# Patient Record
Sex: Male | Born: 1965 | Race: Black or African American | Hispanic: No | Marital: Married | Smoking: Former smoker
Health system: Southern US, Community
[De-identification: ages and names within clinical notes are randomized; demographics above are authoritative.]

## PROBLEM LIST (undated history)

## (undated) DIAGNOSIS — Z9289 Personal history of other medical treatment: Secondary | ICD-10-CM

## (undated) DIAGNOSIS — N486 Induration penis plastica: Secondary | ICD-10-CM

## (undated) DIAGNOSIS — E785 Hyperlipidemia, unspecified: Secondary | ICD-10-CM

## (undated) DIAGNOSIS — E669 Obesity, unspecified: Secondary | ICD-10-CM

## (undated) DIAGNOSIS — K219 Gastro-esophageal reflux disease without esophagitis: Secondary | ICD-10-CM

## (undated) DIAGNOSIS — D179 Benign lipomatous neoplasm, unspecified: Secondary | ICD-10-CM

## (undated) DIAGNOSIS — E559 Vitamin D deficiency, unspecified: Secondary | ICD-10-CM

## (undated) DIAGNOSIS — Z8249 Family history of ischemic heart disease and other diseases of the circulatory system: Secondary | ICD-10-CM

## (undated) HISTORY — DX: Induration penis plastica: N48.6

## (undated) HISTORY — DX: Obesity, unspecified: E66.9

## (undated) HISTORY — DX: Gastro-esophageal reflux disease without esophagitis: K21.9

## (undated) HISTORY — DX: Hyperlipidemia, unspecified: E78.5

## (undated) HISTORY — DX: Personal history of other medical treatment: Z92.89

## (undated) HISTORY — DX: Benign lipomatous neoplasm, unspecified: D17.9

## (undated) HISTORY — PX: OTHER SURGICAL HISTORY: SHX169

## (undated) HISTORY — DX: Family history of ischemic heart disease and other diseases of the circulatory system: Z82.49

## (undated) HISTORY — DX: Vitamin D deficiency, unspecified: E55.9

---

## 1998-04-30 ENCOUNTER — Encounter: Payer: Self-pay | Admitting: Emergency Medicine

## 1998-04-30 ENCOUNTER — Emergency Department (HOSPITAL_COMMUNITY): Admission: EM | Admit: 1998-04-30 | Discharge: 1998-04-30 | Payer: Self-pay | Admitting: Emergency Medicine

## 1998-10-21 ENCOUNTER — Emergency Department (HOSPITAL_COMMUNITY): Admission: EM | Admit: 1998-10-21 | Discharge: 1998-10-21 | Payer: Self-pay | Admitting: Emergency Medicine

## 1998-10-27 ENCOUNTER — Emergency Department (HOSPITAL_COMMUNITY): Admission: EM | Admit: 1998-10-27 | Discharge: 1998-10-27 | Payer: Self-pay | Admitting: Emergency Medicine

## 1999-05-20 ENCOUNTER — Encounter: Payer: Self-pay | Admitting: Emergency Medicine

## 1999-05-20 ENCOUNTER — Emergency Department (HOSPITAL_COMMUNITY): Admission: EM | Admit: 1999-05-20 | Discharge: 1999-05-20 | Payer: Self-pay | Admitting: Emergency Medicine

## 1999-06-30 ENCOUNTER — Emergency Department (HOSPITAL_COMMUNITY): Admission: EM | Admit: 1999-06-30 | Discharge: 1999-06-30 | Payer: Self-pay | Admitting: Internal Medicine

## 1999-10-10 ENCOUNTER — Encounter: Payer: Self-pay | Admitting: Emergency Medicine

## 1999-10-10 ENCOUNTER — Emergency Department (HOSPITAL_COMMUNITY): Admission: EM | Admit: 1999-10-10 | Discharge: 1999-10-10 | Payer: Self-pay | Admitting: Emergency Medicine

## 2007-03-29 ENCOUNTER — Ambulatory Visit: Payer: Self-pay | Admitting: Family Medicine

## 2007-11-30 ENCOUNTER — Ambulatory Visit: Payer: Self-pay | Admitting: Family Medicine

## 2008-01-25 ENCOUNTER — Ambulatory Visit: Payer: Self-pay | Admitting: Family Medicine

## 2009-03-08 ENCOUNTER — Ambulatory Visit: Payer: Self-pay | Admitting: Family Medicine

## 2010-01-06 ENCOUNTER — Ambulatory Visit: Payer: Self-pay | Admitting: Family Medicine

## 2010-03-10 ENCOUNTER — Ambulatory Visit: Payer: Self-pay | Admitting: Family Medicine

## 2010-06-03 ENCOUNTER — Ambulatory Visit: Payer: Self-pay | Admitting: Cardiology

## 2010-06-16 ENCOUNTER — Encounter: Payer: Self-pay | Admitting: Cardiology

## 2010-06-19 ENCOUNTER — Ambulatory Visit: Admit: 2010-06-19 | Payer: Self-pay

## 2010-12-07 HISTORY — PX: CARDIAC CATHETERIZATION: SHX172

## 2010-12-31 ENCOUNTER — Observation Stay (HOSPITAL_COMMUNITY)
Admission: EM | Admit: 2010-12-31 | Discharge: 2010-12-31 | Disposition: A | Discharge status: Home / Self Care | Payer: Self-pay | Attending: Emergency Medicine | Admitting: Emergency Medicine

## 2010-12-31 ENCOUNTER — Emergency Department (HOSPITAL_COMMUNITY): Payer: Self-pay

## 2010-12-31 DIAGNOSIS — R7989 Other specified abnormal findings of blood chemistry: Secondary | ICD-10-CM

## 2010-12-31 DIAGNOSIS — Z91041 Radiographic dye allergy status: Secondary | ICD-10-CM | POA: Insufficient documentation

## 2010-12-31 DIAGNOSIS — R51 Headache: Secondary | ICD-10-CM | POA: Insufficient documentation

## 2010-12-31 DIAGNOSIS — Z7902 Long term (current) use of antithrombotics/antiplatelets: Secondary | ICD-10-CM | POA: Insufficient documentation

## 2010-12-31 DIAGNOSIS — R0602 Shortness of breath: Secondary | ICD-10-CM | POA: Insufficient documentation

## 2010-12-31 DIAGNOSIS — R079 Chest pain, unspecified: Secondary | ICD-10-CM

## 2010-12-31 DIAGNOSIS — E785 Hyperlipidemia, unspecified: Secondary | ICD-10-CM | POA: Insufficient documentation

## 2010-12-31 LAB — POCT I-STAT, CHEM 8
BUN: 18 mg/dL (ref 6–23)
Calcium, Ion: 1.13 mmol/L (ref 1.12–1.32)
Chloride: 98 mEq/L (ref 96–112)
Creatinine, Ser: 1.2 mg/dL (ref 0.50–1.35)
Glucose, Bld: 100 mg/dL — ABNORMAL HIGH (ref 70–99)
HCT: 46 % (ref 39.0–52.0)
Hemoglobin: 15.6 g/dL (ref 13.0–17.0)
Potassium: 3.7 mEq/L (ref 3.5–5.1)
Sodium: 136 mEq/L (ref 135–145)
TCO2: 27 mmol/L (ref 0–100)

## 2010-12-31 LAB — CK TOTAL AND CKMB (NOT AT ARMC)
CK, MB: 3.5 ng/mL (ref 0.3–4.0)
CK, MB: 3.2 ng/mL (ref 0.3–4.0)
Relative Index: 1.8 (ref 0.0–2.5)
Relative Index: 2.3 (ref 0.0–2.5)
Total CK: 154 U/L (ref 7–232)
Total CK: 143 U/L (ref 7–232)

## 2010-12-31 LAB — DIFFERENTIAL
Eosinophils Relative: 2 % (ref 0–5)
Lymphocytes Relative: 28 % (ref 12–46)
Lymphs Abs: 2.1 10*3/uL (ref 0.7–4.0)
Neutrophils Relative %: 61 % (ref 43–77)

## 2010-12-31 LAB — CBC
HCT: 41.2 % (ref 39.0–52.0)
Hemoglobin: 14.6 g/dL (ref 13.0–17.0)
MCV: 85.3 fL (ref 78.0–100.0)
RBC: 4.83 MIL/uL (ref 4.22–5.81)
WBC: 7.5 10*3/uL (ref 4.0–10.5)

## 2010-12-31 LAB — LIPID PANEL
LDL Cholesterol: 117 mg/dL — ABNORMAL HIGH (ref 0–99)
VLDL: 20 mg/dL (ref 0–40)

## 2010-12-31 LAB — TROPONIN I: Troponin I: 0.33 ng/mL (ref <0.30)

## 2010-12-31 LAB — TSH: TSH: 1.865 u[IU]/mL (ref 0.350–4.500)

## 2011-01-02 ENCOUNTER — Ambulatory Visit (INDEPENDENT_AMBULATORY_CARE_PROVIDER_SITE_OTHER): Payer: Self-pay | Admitting: Physician Assistant

## 2011-01-02 ENCOUNTER — Encounter: Payer: Self-pay | Admitting: Physician Assistant

## 2011-01-02 VITALS — BP 100/80 | HR 71 | Ht 72.0 in | Wt 224.0 lb

## 2011-01-02 DIAGNOSIS — R079 Chest pain, unspecified: Secondary | ICD-10-CM

## 2011-01-02 DIAGNOSIS — E785 Hyperlipidemia, unspecified: Secondary | ICD-10-CM

## 2011-01-02 DIAGNOSIS — R002 Palpitations: Secondary | ICD-10-CM | POA: Insufficient documentation

## 2011-01-02 MED ORDER — FAMOTIDINE 20 MG PO TABS: 20.0000 mg | ORAL_TABLET | Freq: Two times a day (BID) | ORAL | Status: DC

## 2011-01-02 NOTE — Progress Notes (Addendum)
History of Present Illness: Primary Cardiologist:  Dr. Marca Ancona  Aaron Patel is a 45 y.o. male who presents for chest fluttering.    He was just in the hospital 7/25 with chest pain.  He had one troponin that returned abnormal at 0.33.  This was concerning for a non-ST elevation myocardial infarction.  He was taken to the cardiac catheterization lab.  This demonstrated no significant CAD.  He had an EF of 60%.  He was done via a right radial approach.  No further cardiac workup was recommended.  It was recommended that he follow up with cardiology on a PRN basis.  Labs: Potassium 3.7, creatinine 1.2, glucose 100, hemoglobin 14.6, TC 186, TG 98, HDL 49, LDL 117, TSH 1.865, BNP 19.  CXR unremarkable.  Head CT unremarkable.  He notes a long history of palpitations.  He feels as though his heart is going to fast.  He did see Dr. Swaziland about 5-6 years ago and had a negative stress test.  He is never wore a monitor.  He denies associated lightheadedness or dizziness.  He denies syncope.  He still has had some chest pressure.  He does note some association with meals.  Belching seems to help his symptoms.  He denies dysphagia or water brash symptoms.  He denies melena or hematemesis.  He will awaken sometimes to catch his breath.  He denies any witnessed snoring or witnessed apneic episodes.  He denies significant daytime hypersomnolence.  He denies edema.  He denies orthopnea.  He denies exertional chest pain or shortness of breath.   Past Medical History  Diagnosis Date  . HLD (hyperlipidemia)   . Headache   . Chest pain     cath 12/31/10: post AV CFX < 10% (no significant CAD), EF 60%     Current Outpatient Prescriptions  Medication Sig Dispense Refill  . lovastatin (MEVACOR) 20 MG tablet Take 20 mg by mouth at bedtime.          Allergies: Allergies  Allergen Reactions  . Iodinated Diagnostic Agents     Social history:  He is in eighth grade teacher.  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   He referees basketball.  He is a nonsmoker.  ROS:  Please see the history of present illness.  All other systems reviewed and negative.  Vital Signs: BP 100/80  Pulse 71  Ht 6' (1.829 m)  Wt 224 lb (101.606 kg)  BMI 30.38 kg/m2  PHYSICAL EXAM: Well nourished, well developed, in no acute distress HEENT: normal Neck: no JVD Endocrine: No thyromegaly Cardiac:  normal S1, S2; RRR; no murmur Lungs:  clear to auscultation bilaterally, no wheezing, rhonchi or rales Abd: soft, nontender, no hepatomegaly Ext: no edema; Right radial site without hematoma  Skin: warm and dry Neuro:  CNs 2-12 intact, no focal abnormalities noted  EKG:  Sinus rhythm, heart rate 71, normal axis, no ischemic changes  ASSESSMENT AND PLAN:

## 2011-01-02 NOTE — Assessment & Plan Note (Signed)
Managed by PCP

## 2011-01-02 NOTE — Assessment & Plan Note (Signed)
Etiology of his symptoms are unclear.  He did have a normal TSH in the hospital.  He may be describing premature contractions.  I cannot rule out the possibility of SVT.  I will set him up for a 2-D echocardiogram and an event monitor.  He will follow up with Dr. Shirlee Latch in 6 weeks.

## 2011-01-02 NOTE — Assessment & Plan Note (Signed)
I suspect he is having problems with acid reflux.  I will place him on Pepcid 20 mg twice a day.

## 2011-01-02 NOTE — Patient Instructions (Signed)
Your physician recommends that you schedule a follow-up appointment in: 02/18/11 @ 4:00 pm to see Dr. Shirlee Latch  Your physician has recommended you make the following change in your medication: START PEPCID 20 MG 1 TABLET TWICE DAILY FOR 4-6 WEEKS THEN TAKE ONLY AS NEEDED TWICE DAILY.  Your physician has recommended that you wear an event monitor DX 785.1. Event monitors are medical devices that record the heart's electrical activity. Doctors most often [place] these monitors to diagnose arrhythmias. Arrhythmias are problems with the speed or rhythm of the heartbeat. The monitor is a small, portable device. You can wear one while you do your normal daily activities. This is usually used to diagnose what is causing palpitations/syncope (passing out).   Your physician has requested that you have an echocardiogram DX 786.50. Echocardiography is a painless test that uses sound waves to create images of your heart. It provides your doctor with information about the size and shape of your heart and how well your heart's chambers and valves are working. This procedure takes approximately one hour. There are no restrictions for this procedure.

## 2011-01-07 DIAGNOSIS — Z9289 Personal history of other medical treatment: Secondary | ICD-10-CM

## 2011-01-07 HISTORY — DX: Personal history of other medical treatment: Z92.89

## 2011-01-08 NOTE — Cardiovascular Report (Signed)
Aaron Patel, Aaron Patel              ACCOUNT NO.:  [id number]  MEDICAL RECORD NO.:  [id number]  LOCATION:                                 FACILITY:  PHYSICIAN:  Lorine Bears, MD     DATE OF BIRTH:  10-09-1965  DATE OF PROCEDURE: DATE OF DISCHARGE:                           CARDIAC CATHETERIZATION   REFERRING PHYSICIAN:  Marca Ancona, MD  PROCEDURES PERFORMED: 1. Left heart catheterization. 2. Coronary angiography. 3. Left ventricular angiography.  INDICATIONS AND CLINICAL HISTORY:  This is a 45 year old male with no previous cardiac history.  He presented with symptoms of chest pain. ECG showed normal sinus rhythm with no significant ischemic changes. His first set of cardiac enzymes was borderline elevated with a troponin of 0.33.  Subsequent troponin came back negative.  Due to his symptoms and elevated cardiac enzymes, cardiac catheterization was recommended. The patient was already given aspirin and Brilinta.  Risks, benefits, and alternatives were discussed with the patient.  ACCESS:  Right radial artery.  STUDY DETAILS:  A standard informed consent was obtained.  The right radial area was prepped in a sterile fashion.  It was anesthetized with 1% lidocaine.  A 5-French sheath was placed in the right radial artery after anterior puncture.  3 mg of verapamil was given through the sheath.  5000 units of heparin was given intravenously.  Coronary angiography was performed with a Jacky catheter.  Left ventricular angiography was performed with a pigtail catheter in the RAO position. All catheter exchanges were done over the wire.  Catheter was then removed over a wire.  The sheath was removed and a TR band was applied. There were no immediate complications.  STUDY FINDINGS:  Hemodynamic findings:  Left ventricular pressure is 121/2 with a left ventricular end-diastolic pressure of 9 mmHg.  Central aortic pressure is 116/71 with a mean pressure of 89 mmHg.  Left  ventricular angiography:  This showed normal LV systolic function and wall motion.  Estimated ejection fraction is 60%.  Coronary angiography: 1. Left main coronary artery:  The vessel is normal in size, but     short.  It is free of any significant disease. 2. Left circumflex:  The vessel is large in size and codominant.  It     has minor irregularities without any obstructive disease.  OM-1 is     small in size and free of significant disease.  OM-2 is medium size     and free of significant disease.  OM-3 is normal in size.  The     posterior AV groove is normal in size with mild proximal disease of     less than 10%.  It gives two posterolateral branches. 3. Left anterior descending artery:  The vessel is normal in size with     minor irregularities, but no evidence of obstructive disease.  The     LAD itself does not reach the apex which is supplied by a large     third diagonal branch which actually wraps around the apex.  First     and second diagonals are normal in size and free of significant     disease. 4. Right coronary artery:  The  vessel is normal in size and     codominant.  It is free of any significant disease.  It gives a     medium-sized PDA distally.  STUDY CONCLUSIONS: 1. No significant coronary artery disease. 2. Normal LV systolic function. 3. Chest pain is likely noncardiac.     Lorine Bears, MD     MA/MEDQ  D:  12/31/2010  T:  01/01/2011  Job:  [id number]  Electronically Signed by Lorine Bears MD on 01/08/2011 02:25:55 PM

## 2011-01-09 ENCOUNTER — Encounter (INDEPENDENT_AMBULATORY_CARE_PROVIDER_SITE_OTHER): Payer: Self-pay

## 2011-01-09 ENCOUNTER — Ambulatory Visit (HOSPITAL_COMMUNITY): Payer: Self-pay | Attending: Cardiology | Admitting: Radiology

## 2011-01-09 DIAGNOSIS — R079 Chest pain, unspecified: Secondary | ICD-10-CM | POA: Insufficient documentation

## 2011-01-09 DIAGNOSIS — R002 Palpitations: Secondary | ICD-10-CM

## 2011-01-09 DIAGNOSIS — R072 Precordial pain: Secondary | ICD-10-CM

## 2011-02-12 ENCOUNTER — Telehealth: Payer: Self-pay | Admitting: *Deleted

## 2011-02-12 NOTE — Telephone Encounter (Signed)
Dr Shirlee Latch reviewed monitor 01/09/11-02/07/11 occasional PVCs , o/w no sig. arrhythmia--LMTCB for pt

## 2011-02-16 NOTE — Telephone Encounter (Signed)
appt 02/18/11 with Dr Shirlee Latch.

## 2011-02-18 ENCOUNTER — Ambulatory Visit (INDEPENDENT_AMBULATORY_CARE_PROVIDER_SITE_OTHER): Payer: Self-pay | Admitting: Cardiology

## 2011-02-18 ENCOUNTER — Encounter: Payer: Self-pay | Admitting: Cardiology

## 2011-02-18 ENCOUNTER — Telehealth: Payer: Self-pay | Admitting: *Deleted

## 2011-02-18 DIAGNOSIS — R002 Palpitations: Secondary | ICD-10-CM

## 2011-02-18 DIAGNOSIS — G4733 Obstructive sleep apnea (adult) (pediatric): Secondary | ICD-10-CM

## 2011-02-18 DIAGNOSIS — G4730 Sleep apnea, unspecified: Secondary | ICD-10-CM

## 2011-02-18 DIAGNOSIS — R079 Chest pain, unspecified: Secondary | ICD-10-CM

## 2011-02-18 DIAGNOSIS — R0602 Shortness of breath: Secondary | ICD-10-CM

## 2011-02-18 NOTE — Patient Instructions (Signed)
Schedule an appointment for a sleep study.  You do not need to schedule a follow-up appointment with Dr Shirlee Latch.

## 2011-02-18 NOTE — Telephone Encounter (Signed)
Dr Shirlee Latch reviewed monitor done 01/09/11-02/07/11 at office visit 02/18/11. Original  report signed by Dr Shirlee Latch returned to [name]

## 2011-02-19 NOTE — Assessment & Plan Note (Signed)
Event monitor showed occasional PVCs, no other concerning finding.  No intervention necessary.

## 2011-02-19 NOTE — Assessment & Plan Note (Signed)
No further chest pain.  Normal coronaries on cath.  Normal echo.  Patient had 1 elevated set of cardiac enzymes (point of care marker) that was likely a lab error.  Other cardiac enzymes were negative.

## 2011-02-19 NOTE — Progress Notes (Signed)
PCP: Dr. Susann Givens  45 yo with minimal past history was admitted to [facility] in 7/12 with chest pain.  He had one troponin that returned abnormal at 0.33.  This was concerning for a non-ST elevation myocardial infarction.  He was taken to the cardiac catheterization lab.  This demonstrated no significant CAD.  He had an EF of 60%.  Echo was done in 8/12, showing EF 60-65%, normal study.  He was seen by Tereso Newcomer in the office, and given history of palpitations, was given an event monitor to assess for arrhythmias.  The monitor showed occasional PVCs, otherwise no concerning findings.  Patient also reports generalized fatigue and daytime sleepiness.  He has morning headaches.  His wife says that he does snore.    Past Medical History  Diagnosis Date  . HLD (hyperlipidemia)   . Headache   . Chest pain     cath 12/31/10: post AV CFX < 10% (no significant CAD), EF 60%     Current Outpatient Prescriptions  Medication Sig Dispense Refill  . lovastatin (MEVACOR) 20 MG tablet Take 20 mg by mouth at bedtime.          Allergies: Allergies  Allergen Reactions  . 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  ROS:  Please see the history of present illness.  All other systems reviewed and negative.  Vital Signs: BP 123/82  Pulse 82  Ht 6' (1.829 m)  Wt 226 lb 12.8 oz (102.876 kg)  BMI 30.76 kg/m2  PHYSICAL EXAM: Well nourished, well developed, in no acute distress HEENT: normal Neck: no JVD Endocrine: No thyromegaly Cardiac:  normal S1, S2; RRR; no murmur Lungs:  clear to auscultation bilaterally, no wheezing, rhonchi or rales Abd: soft, nontender, no hepatomegaly Ext: no edema; Right radial site without hematoma

## 2011-02-19 NOTE — Assessment & Plan Note (Signed)
Patient has symptoms consistent with OSA.  Will arrange for sleep study.

## 2011-02-21 NOTE — Discharge Summary (Signed)
  NAMEBEAUX, WEDEMEYER              ACCOUNT NO.:  [id number]  MEDICAL RECORD NO.:  [id number]  LOCATION:  6522                         FACILITY:  [facility]  PHYSICIAN:  Marca Ancona, MD      DATE OF BIRTH:  [birth year]  DATE OF ADMISSION:  12/31/2010 DATE OF DISCHARGE:  12/31/2010                              DISCHARGE SUMMARY   PROCEDURES: 1. Cardiac catheterization. 2. Coronary arteriogram. 3. Left ventriculogram. 4. CT of the head without contrast media. 5. Chest x-ray.  PRIMARY FINAL DISCHARGE DIAGNOSIS:  Chest pain, no significant coronary artery disease in catheterization.  SECONDARY DIAGNOSES: 1. Positive family history of coronary artery disease. 2. Hyperlipidemia. 3. History of headaches. 4. Allergy to IV DYE.  TIME OF DISCHARGE:  32 minutes.  HOSPITAL COURSE:  Mr. Meckes is a 45 year old male with no previous history of coronary artery disease.  He had several cardiac risk factors and had chest pain.  He came to the hospital where he was admitted for further evaluation and treatment.  The CK-MBs were all negative but his initial troponin was slightly elevated at 0.33.  Lipid profile showed HDL of 49 and LDL of 117.  Dr. Shirlee Latch evaluated Mr. Habermann and because of ongoing chest tightness that was treated successfully with aspirin, beta blocker, statins, and nitroglycerin paste.  He was cathed.  The cardiac catheterization showed no significant coronary artery disease and an EF of 60% with normal wall motion.  The patient's chest pain resolved.  His other labs were within normal limits.  Pending completion of bed rest, Mr. Bourke is considered stable for discharge, to follow up with primary care as an outpatient and with Cardiology p.r.n..  DISCHARGE INSTRUCTIONS:  His activity level is to be increased gradually with no lifting for 2 weeks with his right arm.  He is to call our office for problems with the cath site.  He is to follow up with Dr. Susann Givens and call  for an appointment.  He is to follow up with Dr. Shirlee Latch as needed.  DISCHARGE MEDICATIONS:  Lovastatin 20 mg daily.     Theodore Demark, PA-C   ______________________________ Marca Ancona, MD    RB/MEDQ  D:  12/31/2010  T:  01/01/2011  Job:  [id number]  cc:   Sharlot Gowda, M.D.  Electronically Signed by Theodore Demark PA-C on 01/08/2011 06:47:18 AM Electronically Signed by Marca Ancona MD on 02/21/2011 10:15:31 PM

## 2011-02-21 NOTE — H&P (Signed)
NAMECHAY, MAZZONI              ACCOUNT NO.:  [id number]  MEDICAL RECORD NO.:  [id number]  LOCATION:  [facility]                         FACILITY:  [facility]  PHYSICIAN:  Marca Ancona, MD      DATE OF BIRTH:  June 10, 1965  DATE OF ADMISSION:  12/31/2010 DATE OF DISCHARGE:                             HISTORY & PHYSICAL   HISTORY OF PRESENT ILLNESS:  This is a 45 year old with family history of congestive heart failure and possible coronary disease as well as personal history of hyperlipidemia who presents with chest pressure and elevated troponin consistent with a non-ST-elevation MI.  Two weeks, ago, the patient had episode of severe shortness of breath while watching basketball game.  He had no symptoms after that until that night when he was sitting in his computer when he had an episode of substernal chest tightness radiating to the jaw and to the left arm. This lasted a few minutes and resolved.  However, since then he has had brief similar episodes of substernal chest tightness on and off.  He has had them since he has been in the emergency room.  He has no history of exertional chest pain.  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  PAST MEDICAL HISTORY: 1. Hyperlipidemia. 2. Headaches.  MEDICATIONS:  Lovastatin 20 mg daily.  SOCIAL HISTORY:  The patient lives in [place] with his wife.  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  He has two children. He is nonsmoker, does not drink alcohol or use any drugs.  FAMILY HISTORY:  Mother died of congestive heart failure at 34.  Father had a heart transplant in the 47s, felt this might have been secondary to coronary disease, but he is not sure.  REVIEW OF SYSTEMS:  All systems reviewed were negative except as per the history of present illness.  PHYSICAL EXAMINATION:  VITAL SIGNS:  Temperature 98.3, pulse 69 regular, blood pressure  147/90, oxygen saturation 97% on room air. GENERAL:  This is a well-developed male in no apparent distress. NEUROLOGIC:  Alert and oriented x3.  Normal affect. HEENT:  Normal exam. NECK:  JVP has 7-8 cm of water.  There is no thyromegaly or thyroid nodule. CARDIOVASCULAR:  Heart regular S1 and S2.  No S3, no S4.  No peripheral edema.  2+ pulses posterior tibial pulses bilaterally.  EXTREMITIES:  No clubbing or cyanosis. LUNGS:  Clear to auscultation bilaterally with normal respiratory effort. SKIN:  Normal exam. MUSCULOSKELETAL: Normal exam.  LABORATORY DATA:  Radiology, chest x-ray is clear.  CT of head is negative.  EKG is normal sinus rhythm, this is basically a normal EKG. White count 7.5, hematocrit 41.2, platelets 254, potassium 3.7, creatinine 1.2, CK 206, CK-MB  3.8, troponin 0.33.  IMPRESSION:  This is a 45 year old with history of hyperlipidemia and family history of heart disease who presents with a non-ST-elevation myocardial infarction.  He has ongoing chest tightness that has been on and off since he has been in the emergency department.  We will start him on heparin drip, treat him with aspirin, beta-blocker, and statin. We will put  him on nitroglycerin paste.  Given his ongoing chest pain episodes, I am going to go ahead and add Ticagrelor to his regimen.  We will repeat his cardiac enzymes and plan a left heart catheterization later this morning.     Marca Ancona, MD     DM/MEDQ  D:  12/31/2010  T:  12/31/2010  Job:  [id number]  Electronically Signed by Marca Ancona MD on 02/21/2011 10:15:26 PM

## 2011-02-23 ENCOUNTER — Telehealth: Payer: Self-pay

## 2011-02-23 NOTE — Telephone Encounter (Signed)
Left message for pt that we have apnea link witch is free and if he would like this to do this to call me back

## 2011-02-26 ENCOUNTER — Telehealth: Payer: Self-pay

## 2011-02-26 NOTE — Telephone Encounter (Signed)
Called left message to expect a call from [facility] to set up sleep study

## 2011-03-03 ENCOUNTER — Encounter: Payer: Self-pay | Admitting: Cardiology

## 2011-03-03 ENCOUNTER — Ambulatory Visit (HOSPITAL_BASED_OUTPATIENT_CLINIC_OR_DEPARTMENT_OTHER): Payer: Self-pay | Attending: Cardiology

## 2011-03-03 DIAGNOSIS — R0609 Other forms of dyspnea: Secondary | ICD-10-CM | POA: Insufficient documentation

## 2011-03-03 DIAGNOSIS — G4730 Sleep apnea, unspecified: Secondary | ICD-10-CM

## 2011-03-03 DIAGNOSIS — R0989 Other specified symptoms and signs involving the circulatory and respiratory systems: Secondary | ICD-10-CM | POA: Insufficient documentation

## 2011-03-03 DIAGNOSIS — R0602 Shortness of breath: Secondary | ICD-10-CM

## 2011-03-03 DIAGNOSIS — G4710 Hypersomnia, unspecified: Secondary | ICD-10-CM | POA: Insufficient documentation

## 2011-03-07 DIAGNOSIS — R0989 Other specified symptoms and signs involving the circulatory and respiratory systems: Secondary | ICD-10-CM

## 2011-03-07 DIAGNOSIS — R0609 Other forms of dyspnea: Secondary | ICD-10-CM

## 2011-03-07 NOTE — Procedures (Signed)
Aaron, Patel              ACCOUNT NO.:  [id number]  MEDICAL RECORD NO.:  [id number]          PATIENT TYPE:  OUT  LOCATION:  SLEEP CENTER                 FACILITY:  [facility]  PHYSICIAN:  Barbaraann Share, MD,FCCPDATE OF BIRTH:  06-28-1966  DATE OF STUDY:  03/03/2011                           NOCTURNAL POLYSOMNOGRAM  REFERRING PHYSICIAN:  Marca Ancona, MD  INDICATION FOR STUDY:  Hypersomnia with sleep apnea.  EPWORTH SLEEPINESS SCORE:  12.  MEDICATIONS:  SLEEP ARCHITECTURE:  The patient had a total sleep time of 336 minutes with no slow wave sleep and only 25 minutes of REM.  Sleep onset latency was normal at 6 minutes and REM onset was very prolonged at 269 minutes. Sleep efficiency was moderately reduced at 81%.  RESPIRATORY DATA:  The patient was found to have 3 central apneas and 2 obstructive hypopneas, giving him an apnea/hypopnea index of only 0.9 events per hour.  The events were not positional and there was moderate snoring noted throughout.  The patient did not meet split night criteria secondary to his very small numbers of events.  OXYGEN DATA:  There was O2 desaturation as low as 90% with the patient's obstructive events.  CARDIAC DATA:  No clinically significant arrhythmias were noted.  MOVEMENT-PARASOMNIA:  The patient had no significant leg jerks or other abnormal behavior seen.  IMPRESSIONS-RECOMMENDATIONS:  Small numbers of obstructive and central events, which do not meet the AHI criteria for the obstructive sleep apnea syndrome.  There was moderate snoring noted throughout.  There was no significant oxygen desaturation.     Barbaraann Share, MD,FCCP Diplomate, American Board of Sleep Medicine Electronically Signed   [facility]/MEDQ  D:  03/07/2011 13:41:32  T:  03/07/2011 13:51:07  Job:  [id number]

## 2011-03-09 ENCOUNTER — Telehealth: Payer: Self-pay | Admitting: Cardiology

## 2011-03-09 NOTE — Telephone Encounter (Signed)
Pt calling re problem with meds °

## 2011-03-09 NOTE — Telephone Encounter (Signed)
I talked with pt. Pt asking if Dr Shirlee Latch thinks recent problems with chest pain are related to changing from atorvastatin to lovastatin recommended by his PCP  in the spring because of insurance changes. Dr Shirlee Latch did not feel these were related.  Dr Shirlee Latch also reviewed sleep study done 03/03/11 (looks fine)  and pt was given these results.

## 2011-04-09 ENCOUNTER — Ambulatory Visit (INDEPENDENT_AMBULATORY_CARE_PROVIDER_SITE_OTHER): Payer: PRIVATE HEALTH INSURANCE | Admitting: Family Medicine

## 2011-04-09 ENCOUNTER — Encounter: Payer: Self-pay | Admitting: Family Medicine

## 2011-04-09 VITALS — BP 120/78 | HR 85 | Wt 227.0 lb

## 2011-04-09 DIAGNOSIS — R0602 Shortness of breath: Secondary | ICD-10-CM

## 2011-04-09 NOTE — Progress Notes (Signed)
  Subjective:    Patient ID: Aaron Patel, male    DOB: 1965/08/01, 45 y.o.   MRN: [id number]  HPI He is here for consultation concerning difficulty with sleep. He notes that when he lies down, he feels as if his heart rate speeds up, become short of breath and has flushing. He has not checked his pulse rate. He has not had nausea, vomiting, indigestion symptoms. He has been using, p.m. to help with his sleep. He did have a sleep study done over a month ago unfortunately I do not have that record. Over the last several months he is also had a hospitalization and was evaluated from a cardiology point of view. The evaluation so far has been negative.   Review of Systems     Objective:   Physical Exam alert and in no distress. Tympanic membranes and canals are normal. Throat is clear. Tonsils are normal. Neck is supple without adenopathy or thyromegaly. Cardiac exam shows a regular sinus rhythm without murmurs or gallops. Lungs are clear to auscultation. Abdominal exam shows no masses or tenderness with normal bowel sounds The medical record from the hospitalization and his cardiologist was reviewed.       Assessment & Plan:  Shortness of breath, etiology unclear. I will get his sleep study and evaluate that. Also recommend he try Prilosec at night to see if this will have any positive effect on his symptoms. Also recommend he keep track of his pulse rate.

## 2011-04-09 NOTE — Patient Instructions (Signed)
Check your pulse next time you feel that her heart rate is racing and monitor it. I10 how fast and whether it's regular or not. Take 2 Prilosec prior to going to bed for the next week. We'll chase down your sleep study. P. attention to see if food is at all related to this

## 2011-04-10 ENCOUNTER — Encounter: Payer: Self-pay | Admitting: Family Medicine

## 2011-04-14 ENCOUNTER — Other Ambulatory Visit: Payer: Self-pay | Admitting: Family Medicine

## 2011-04-14 MED ORDER — METOPROLOL SUCCINATE ER 25 MG PO TB24: 25.0000 mg | ORAL_TABLET | Freq: Every day | ORAL | Status: DC

## 2011-04-14 NOTE — Progress Notes (Signed)
He reports having a heart rate of up to 124 when he lies down. This was discussed with Dr. Sw[place]il[place]. I will place him on Toprol to see if this will help with his symptoms. He is also to return here in approximately one month for recheck on his lipids.

## 2011-05-04 ENCOUNTER — Ambulatory Visit (INDEPENDENT_AMBULATORY_CARE_PROVIDER_SITE_OTHER): Payer: PRIVATE HEALTH INSURANCE | Admitting: Medical

## 2011-05-04 ENCOUNTER — Encounter: Payer: Self-pay | Admitting: Medical

## 2011-05-04 VITALS — BP 130/80 | HR 88 | Temp 98.2°F | Wt 228.0 lb

## 2011-05-04 DIAGNOSIS — N486 Induration penis plastica: Secondary | ICD-10-CM | POA: Insufficient documentation

## 2011-05-04 DIAGNOSIS — N489 Disorder of penis, unspecified: Secondary | ICD-10-CM

## 2011-05-04 MED ORDER — IBUPROFEN 600 MG PO TABS: 600.0000 mg | ORAL_TABLET | Freq: Four times a day (QID) | ORAL | Status: AC | PRN

## 2011-05-04 NOTE — Progress Notes (Signed)
Subjective:   HPI  Aaron Patel is a 45 y.o. male who presents with concern for lump on his urethra.  Noticed this a week or so ago.  During erection gets ome pain in the area, otherwise nontender. No problems urinating, no burning, no hesitancy, no penile discharge.  No prior similar episode.  Married, no concern for STD.  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   No other aggravating or relieving factors.    No other c/o.  The following portions of the patient's history were reviewed and updated as appropriate: allergies, current medications, past family history, past medical history, past social history, past surgical history and problem list.  Past Medical History  Diagnosis Date  . HLD (hyperlipidemia)   . Headache   . Chest pain     cath 12/31/10: post AV CFX < 10% (no significant CAD), EF 60%     Review of Systems Constitutional: -fever, -chills, -sweats, -unexpected -weight change,-fatigue ENT: -runny nose, -ear pain, -sore throat Cardiology:  -chest pain, +palpitations, -edema Respiratory: -cough, -shortness of breath, -wheezing Gastroenterology: -abdominal pain, -nausea, -vomiting, -diarrhea, -constipation Hematology: -bleeding or bruising problems Musculoskeletal: -arthralgias, -myalgias, -joint swelling, -back pain Ophthalmology: -vision changes Urology: -dysuria, -difficulty urinating, -hematuria, -urinary frequency, -urgency Neurology: +headache, -weakness, -tingling, +numbness      Objective:   Physical Exam  Filed Vitals:   05/04/11 1342  BP: 130/80  Pulse: 88  Temp: 98.2 F (36.8 C)    General appearance: alert, no distress, WD/WN, black male Abdomen: +bs, soft, non tender, non distended, no masses, no hepatomegaly, no splenomegaly GU: dorsal left penis with few small 1-28mm nodules and somewhat dense nodular linear finding along length of penis, possible calcifications, possible peyronie's, but unclear.  Otherwise penis normal, no  discharge or rash.  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  . Penile abnormality Yes   Etiology unclear.  Discussed case with supervising physician, Dr. Susann Givens who also examined him.   No obvious exam or symptoms suggesting STD though.  Advised to use warm compresses, script for Ibuprofen today, and recheck in 2-4 weeks.  If not improving at that time, will refer to Urology.   He will return soon for physical and fasting labs.

## 2011-06-03 ENCOUNTER — Ambulatory Visit: Payer: PRIVATE HEALTH INSURANCE | Admitting: Family Medicine

## 2011-06-11 ENCOUNTER — Ambulatory Visit: Payer: PRIVATE HEALTH INSURANCE | Admitting: Family Medicine

## 2011-09-07 ENCOUNTER — Ambulatory Visit (INDEPENDENT_AMBULATORY_CARE_PROVIDER_SITE_OTHER): Payer: 59 | Admitting: Medical

## 2011-09-07 ENCOUNTER — Encounter: Payer: Self-pay | Admitting: Medical

## 2011-09-07 VITALS — BP 110/80 | HR 68 | Temp 98.2°F | Resp 16 | Wt 224.0 lb

## 2011-09-07 DIAGNOSIS — Z23 Encounter for immunization: Secondary | ICD-10-CM

## 2011-09-07 DIAGNOSIS — R002 Palpitations: Secondary | ICD-10-CM

## 2011-09-07 DIAGNOSIS — D179 Benign lipomatous neoplasm, unspecified: Secondary | ICD-10-CM

## 2011-09-07 DIAGNOSIS — Z0000 Encounter for general adult medical examination without abnormal findings: Secondary | ICD-10-CM

## 2011-09-07 DIAGNOSIS — Z125 Encounter for screening for malignant neoplasm of prostate: Secondary | ICD-10-CM

## 2011-09-07 DIAGNOSIS — Q5569 Other congenital malformation of penis: Secondary | ICD-10-CM

## 2011-09-07 DIAGNOSIS — E785 Hyperlipidemia, unspecified: Secondary | ICD-10-CM

## 2011-09-07 LAB — POCT URINALYSIS DIPSTICK
Bilirubin, UA: NEGATIVE
Blood, UA: NEGATIVE
Glucose, UA: NEGATIVE
Nitrite, UA: NEGATIVE
Spec Grav, UA: 1.025

## 2011-09-07 LAB — CBC WITH DIFFERENTIAL/PLATELET
Basophils Absolute: 0 10*3/uL (ref 0.0–0.1)
Basophils Relative: 1 % (ref 0–1)
Eosinophils Absolute: 0.1 10*3/uL (ref 0.0–0.7)
Hemoglobin: 14.8 g/dL (ref 13.0–17.0)
MCH: 30.1 pg (ref 26.0–34.0)
MCHC: 33.5 g/dL (ref 30.0–36.0)
Monocytes Relative: 9 % (ref 3–12)
Neutro Abs: 2.8 10*3/uL (ref 1.7–7.7)
Neutrophils Relative %: 54 % (ref 43–77)
Platelets: 296 10*3/uL (ref 150–400)
RDW: 13.4 % (ref 11.5–15.5)

## 2011-09-07 LAB — COMPREHENSIVE METABOLIC PANEL
AST: 20 U/L (ref 0–37)
Albumin: 4.3 g/dL (ref 3.5–5.2)
BUN: 13 mg/dL (ref 6–23)
Calcium: 9.1 mg/dL (ref 8.4–10.5)
Chloride: 104 mEq/L (ref 96–112)
Potassium: 4.2 mEq/L (ref 3.5–5.3)
Sodium: 139 mEq/L (ref 135–145)
Total Protein: 6.7 g/dL (ref 6.0–8.3)

## 2011-09-07 LAB — TSH: TSH: 1.738 u[IU]/mL (ref 0.350–4.500)

## 2011-09-07 LAB — LIPID PANEL: LDL Cholesterol: 123 mg/dL — ABNORMAL HIGH (ref 0–99)

## 2011-09-07 LAB — PSA: PSA: 0.7 ng/mL (ref <=4.00)

## 2011-09-07 NOTE — Patient Instructions (Signed)
Preventative Care for Adults, Male       REGULAR HEALTH EXAMS:  A routine yearly physical is a good way to check in with your primary care provider about your health and preventive screening. It is also an opportunity to share updates about your health and any concerns you have, and receive a thorough all-over exam.   Most health insurance companies pay for at least some preventative services.  Check with your health plan for specific coverages.  WHAT PREVENTATIVE SERVICES DO MEN NEED?  Adult men should have their weight and blood pressure checked regularly.   Men age 35 and older should have their cholesterol levels checked regularly.  Beginning at age 50 and continuing to age 75, men should be screened for colorectal cancer.  Certain people should may need continued testing until age 85.  Other cancer screening may include exams for testicular and prostate cancer.  Updating vaccinations is part of preventative care.  Vaccinations help protect against diseases such as the flu.  Lab tests are generally done as part of preventative care to screen for anemia and blood disorders, to screen for problems with the kidneys and liver, to screen for bladder problems, to check blood sugar, and to check your cholesterol level.  Preventative services generally include counseling about diet, exercise, avoiding tobacco, drugs, excessive alcohol consumption, and sexually transmitted infections.    GENERAL RECOMMENDATIONS FOR GOOD HEALTH:  Healthy diet:  Eat a variety of foods, including fruit, vegetables, animal or vegetable protein, such as meat, fish, chicken, and eggs, or beans, lentils, tofu, and grains, such as rice.  Drink plenty of water daily.  Decrease saturated fat in the diet, avoid lots of red meat, processed foods, sweets, fast foods, and fried foods.  Exercise:  Aerobic exercise helps maintain good heart health. At least 30-40 minutes of moderate-intensity exercise is recommended.  For example, a brisk walk that increases your heart rate and breathing. This should be done on most days of the week.   Find a type of exercise or a variety of exercises that you enjoy so that it becomes a part of your daily life.  Examples are running, walking, swimming, water aerobics, and biking.  For motivation and support, explore group exercise such as aerobic class, spin class, Zumba, Yoga,or  martial arts, etc.    Set exercise goals for yourself, such as a certain weight goal, walk or run in a race such as a 5k walk/run.  Speak to your primary care provider about exercise goals.  Disease prevention:  If you smoke or chew tobacco, find out from your caregiver how to quit. It can literally save your life, no matter how long you have been a tobacco user. If you do not use tobacco, never begin.   Maintain a healthy diet and normal weight. Increased weight leads to problems with blood pressure and diabetes.   The Body Mass Index or BMI is a way of measuring how much of your body is fat. Having a BMI above 27 increases the risk of heart disease, diabetes, hypertension, stroke and other problems related to obesity. Your caregiver can help determine your BMI and based on it develop an exercise and dietary program to help you achieve or maintain this important measurement at a healthful level.  High blood pressure causes heart and blood vessel problems.  Persistent high blood pressure should be treated with medicine if weight loss and exercise do not work.   Fat and cholesterol leaves deposits in your arteries   that can block them. This causes heart disease and vessel disease elsewhere in your body.  If your cholesterol is found to be high, or if you have heart disease or certain other medical conditions, then you may need to have your cholesterol monitored frequently and be treated with medication.   Ask if you should have a stress test if your history suggests this. A stress test is a test done on  a treadmill that looks for heart disease. This test can find disease prior to there being a problem.  Avoid drinking alcohol in excess (more than two drinks per day).  Avoid use of street drugs. Do not share needles with anyone. Ask for professional help if you need assistance or instructions on stopping the use of alcohol, cigarettes, and/or drugs.  Brush your teeth twice a day with fluoride toothpaste, and floss once a day. Good oral hygiene prevents tooth decay and gum disease. The problems can be painful, unattractive, and can cause other health problems. Visit your dentist for a routine oral and dental check up and preventive care every 6-12 months.   Look at your skin regularly.  Use a mirror to look at your back. Notify your caregivers of changes in moles, especially if there are changes in shapes, colors, a size larger than a pencil eraser, an irregular border, or development of new moles.  Safety:  Use seatbelts 100% of the time, whether driving or as a passenger.  Use safety devices such as hearing protection if you work in environments with loud noise or significant background noise.  Use safety glasses when doing any work that could send debris in to the eyes.  Use a helmet if you ride a bike or motorcycle.  Use appropriate safety gear for contact sports.  Talk to your caregiver about gun safety.  Use sunscreen with a SPF (or skin protection factor) of 15 or greater.  Lighter skinned people are at a greater risk of skin cancer. Don't forget to also wear sunglasses in order to protect your eyes from too much damaging sunlight. Damaging sunlight can accelerate cataract formation.   Practice safe sex. Use condoms. Condoms are used for birth control and to help reduce the spread of sexually transmitted infections (or STIs).  Some of the STIs are gonorrhea (the clap), chlamydia, syphilis, trichomonas, herpes, HPV (human papilloma virus) and HIV (human immunodeficiency virus) which causes AIDS.  The herpes, HIV and HPV are viral illnesses that have no cure. These can result in disability, cancer and death.   Keep carbon monoxide and smoke detectors in your home functioning at all times. Change the batteries every 6 months or use a model that plugs into the wall.   Vaccinations:  Stay up to date with your tetanus shots and other required immunizations. You should have a booster for tetanus every 10 years. Be sure to get your flu shot every year, since 5%-20% of the [place] population comes down with the flu. The flu vaccine changes each year, so being vaccinated once is not enough. Get your shot in the fall, before the flu season peaks.   Other vaccines to consider:  Pneumococcal vaccine to protect against certain types of pneumonia.  This is normally recommended for adults age 65 or older.  However, adults younger than 45 years old with certain underlying conditions such as diabetes, heart or lung disease should also receive the vaccine.  Shingles vaccine to protect against Varicella Zoster if you are older than age 60, or younger   than 45 years old with certain underlying illness.  Hepatitis A vaccine to protect against a form of infection of the liver by a virus acquired from food.  Hepatitis B vaccine to protect against a form of infection of the liver by a virus acquired from blood or body fluids, particularly if you work in health care.  If you plan to travel internationally, check with your local health department for specific vaccination recommendations.  Cancer Screening:  Most routine colon cancer screening begins at the age of 50. On a yearly basis, doctors may provide special easy to use take-home tests to check for hidden blood in the stool. Sigmoidoscopy or colonoscopy can detect the earliest forms of colon cancer and is life saving. These tests use a small camera at the end of a tube to directly examine the colon. Speak to your caregiver about this at age 50, when routine  screening begins (and is repeated every 5 years unless early forms of pre-cancerous polyps or small growths are found).   At the age of 50 men usually start screening for prostate cancer every year. Screening may begin at a younger age for those with higher risk. Those at higher risk include African-Americans or having a family history of prostate cancer. There are two types of tests for prostate cancer:   Prostate-specific antigen (PSA) testing. Recent studies raise questions about prostate cancer using PSA and you should discuss this with your caregiver.   Digital rectal exam (in which your doctor's lubricated and gloved finger feels for enlargement of the prostate through the anus).   Screening for testicular cancer.  Do a monthly exam of your testicles. Gently roll each testicle between your thumb and fingers, feeling for any abnormal lumps. The best time to do this is after a hot shower or bath when the tissues are looser. Notify your caregivers of any lumps, tenderness or changes in size or shape immediately.     

## 2011-09-07 NOTE — Progress Notes (Signed)
Subjective:   HPI  Aaron Patel is a 46 y.o. male who presents for a complete physical.    Preventative care: Last tetanus 2002 Last ophthalmology - 2 years ago Last dental visit - 2 years ago Flu shot yearly  Concerns today: 1) went to urology recent about the penile concern, diagnosed with Peyrones disease.  Was advise to either try herbal Hawthorne and Horsetail, or consider steroidal injection into the penis.  2) ongoing palpitations of heart x 6 months.  He had sleep study and cardiac catheterization last year due to chest pains and palpitations - gets these worse at night with sleep or if he lie on stomach 30 min.  No associated numbness, SOB, sweats, nauseas, or radiating pain.   3) he notes prior sleep study, not sure if he needs another study.  Wonders if his sleep is related to the palpitations  Reviewed their medical, surgical, family, social, medication, and allergy history and updated chart as appropriate.   Past Medical History  Diagnosis Date  . HLD (hyperlipidemia)   . Headache   . Chest pain     cath 12/31/10: post AV CFX < 10% (no significant CAD), EF 60%   . Family history of ischemic heart disease     Past Surgical History  Procedure Date  . Cardiac catheterization 12/2010    Dr. Jearld Pies   . Stye surgery     Family History  Problem Relation Age of Onset  . Asthma Mother   . Mental illness Mother   . Heart disease Mother     died of CHF  . Arthritis Father   . Diabetes Father   . Heart disease Father     heart transplant, died of heart disease 5 years later  . Arthritis Paternal Aunt   . Diabetes Paternal Aunt   . Arthritis Paternal Uncle   . Diabetes Paternal Uncle   . Hypertension Brother   . Clotting disorder Maternal Aunt   . Cancer Maternal Grandmother   . Stroke Neg Hx     History   Social History  . Marital Status: Married    Spouse Name: N/A    Number of Children: N/A  . Years of Education: N/A   Occupational History  .  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 Smoking status: Former Smoker -- 0.5 packs/day for 1 years    Quit date: 06/08/2006  . Smokeless tobacco: Not on file  . Alcohol Use: Not on file  . Drug Use: No  . Sexually Active: Not on file   Other Topics Concern  . Not on file   Social History Narrative   Basketball, coaches basketball, married, 2 children both teenagers    Current Outpatient Prescriptions on File Prior to Visit  Medication Sig Dispense Refill  . lovastatin (MEVACOR) 20 MG tablet Take 20 mg by mouth at bedtime.          Allergies  Allergen Reactions  . Iodinated Diagnostic Agents    Review of Systems Constitutional: -fever, -chills, -sweats, -unexpected weight change, -anorexia, -fatigue Allergy: -sneezing, -itching, -congestion Dermatology: denies changing moles, rash, lumps, new worrisome lesions ENT: -runny nose, -ear pain, -sore throat, -hoarseness, -sinus pain, -teeth pain, -tinnitus, -hearing loss, -epistaxis Cardiology:  -chest pain, +palpitations, -edema, -orthopnea, -paroxysmal nocturnal dyspnea Respiratory: -cough, -shortness of breath, -dyspnea on exertion, -wheezing, -hemoptysis Gastroenterology: -abdominal pain, -nausea, -vomiting, -diarrhea, -constipation, -blood in stool, -changes in  bowel movement, -dysphagia Hematology: -bleeding or bruising problems Musculoskeletal: -arthralgias, -myalgias, -joint swelling, -back pain, -neck pain, -cramping, -gait changes Ophthalmology: -vision changes, -eye redness, -itching, -discharge Urology: -dysuria, -difficulty urinating, -hematuria, -urinary frequency, -urgency, incontinence Neurology: -headache, -weakness, -tingling, +numbness, -speech abnormality, -memory loss, -falls, -dizziness Psychology:  -depressed mood, -agitation, +sleep problems      Objective:   Physical Exam  Filed Vitals:   09/07/11 0859  BP: 110/80  Pulse: 68  Temp: 98.2 F  (36.8 C)  Resp: 16    General appearance: alert, no distress, WD/WN, african [place] male Skin: left shoulder with slightly raised round brown 8mm lesion unchanged for years per patient, few small 2-3 mm papular lesions of the face, benign-appearing, unchanged for years per patient, other scattered benign appearing macules  HEENT: normocephalic, conjunctiva/corneas normal, sclerae anicteric, PERRLA, EOMi, nares patent, no discharge or erythema, pharynx normal Oral cavity: MMM, tongue normal, teeth in good repair Neck: supple, no lymphadenopathy, no thyromegaly, no masses, normal ROM, no bruits Chest: non tender, normal shape and expansion Heart: RRR, normal S1, S2, no murmurs Lungs: CTA bilaterally, no wheezes, rhonchi, or rales Abdomen: +bs, soft, non tender, non distended, no masses, no hepatomegaly, no splenomegaly, no bruits Back: mid to low back with 8-9 cm diameter raised mound of spongy tissue, c/w lipoma, otherwise non tender, normal ROM, no scoliosis Musculoskeletal: upper extremities non tender, no obvious deformity, normal ROM throughout, lower extremities non tender, no obvious deformity, normal ROM throughout Extremities: no edema, no cyanosis, no clubbing Pulses: 2+ symmetric, upper and lower extremities, normal cap refill Neurological: alert, oriented x 3, CN2-12 intact, strength normal upper extremities and lower extremities, sensation normal throughout, DTRs 2+ throughout, no cerebellar signs, gait normal Psychiatric: normal affect, behavior normal, pleasant  GU: deep fibrous tissue along shaft of penis, c/w peryone's disease, otherwise normal male external genitalia, nontender, right supratesticular mass c/w 1.4 - cm roundish mass suggestive of spermatocele, no hernia, no lymphadenopathy Rectal: anus with 1 solitary small hemorrhoid, occult negative stool, prostate WNL, no nodules   Assessment and Plan :    Encounter Diagnoses  Name Primary?  . Routine general medical  examination at a health care facility Yes  . Palpitations   . Penile anomaly   . Prostate cancer screening   . Hyperlipidemia   . Need for diphtheria-tetanus-pertussis (Tdap) vaccine   . Lipoma     Physical exam - discussed healthy lifestyle, diet, exercise, preventative care, vaccinations, and addressed their concerns.  Advised yearly ophthalmology visit, dental visits q56mo for hygiene.  Updated his Tdap vaccine, VIS and vaccine counseling given today.  Palpitations - I reviewed his prior stress echo report that was normal, palpitations thought to be rare ectopic beats, prior cardiology notes.  Reviewed sleep study from 2012 which was insignificant for apnea or desaturation.  I recommended Clonazepam or similar for possible anxiety related palpations and help getting to sleep.  He declines for now pending labs.     penile anomaly/peyrone - I recommended he try the herbs that urology suggested.  Lipoma - watch and wait approach.  As it gets bigger will at some point need general surgery consult.  He declines surgery consult at this time.  Follow-up pending labs

## 2011-09-08 DIAGNOSIS — E785 Hyperlipidemia, unspecified: Secondary | ICD-10-CM | POA: Insufficient documentation

## 2011-09-09 ENCOUNTER — Other Ambulatory Visit: Payer: Self-pay | Admitting: Medical

## 2011-09-09 MED ORDER — CLONAZEPAM 0.5 MG PO TABS: 0.5000 mg | ORAL_TABLET | Freq: Every evening | ORAL | Status: DC | PRN

## 2011-09-09 MED ORDER — LOVASTATIN 20 MG PO TABS: 20.0000 mg | ORAL_TABLET | Freq: Every day | ORAL | Status: DC

## 2011-09-09 NOTE — Progress Notes (Signed)
Called in med to pharmacy  

## 2011-09-28 ENCOUNTER — Ambulatory Visit (INDEPENDENT_AMBULATORY_CARE_PROVIDER_SITE_OTHER): Payer: Self-pay | Admitting: General Surgery

## 2011-10-07 ENCOUNTER — Encounter (INDEPENDENT_AMBULATORY_CARE_PROVIDER_SITE_OTHER): Payer: Self-pay | Admitting: General Surgery

## 2011-11-04 ENCOUNTER — Telehealth: Payer: Self-pay | Admitting: Medical

## 2011-11-04 MED ORDER — LOVASTATIN 20 MG PO TABS: 20.0000 mg | ORAL_TABLET | Freq: Every day | ORAL | Status: DC

## 2011-11-04 NOTE — Telephone Encounter (Signed)
RX WAS ORDERED ADND SENT TO THE PATIENTS PHARMACY. CLS

## 2011-11-24 ENCOUNTER — Ambulatory Visit: Payer: 59 | Admitting: Nurse Practitioner

## 2011-11-25 ENCOUNTER — Ambulatory Visit (INDEPENDENT_AMBULATORY_CARE_PROVIDER_SITE_OTHER): Payer: 59 | Admitting: General Surgery

## 2011-11-25 ENCOUNTER — Encounter (INDEPENDENT_AMBULATORY_CARE_PROVIDER_SITE_OTHER): Payer: Self-pay | Admitting: General Surgery

## 2011-11-25 VITALS — BP 128/80 | HR 76 | Temp 98.2°F | Resp 14 | Ht 72.0 in | Wt 228.4 lb

## 2011-11-25 DIAGNOSIS — D171 Benign lipomatous neoplasm of skin and subcutaneous tissue of trunk: Secondary | ICD-10-CM

## 2011-11-25 DIAGNOSIS — D1779 Benign lipomatous neoplasm of other sites: Secondary | ICD-10-CM

## 2011-11-25 NOTE — Progress Notes (Signed)
Patient ID: Aaron Patel, male   DOB: 08/06/1965, 46 y.o.   MRN: [id number]  Chief Complaint  Patient presents with  . Lipoma    new pt- eval lipoma on back    HPI Aaron Patel is a 46 y.o. male.  HPI 46 year old [place] American male referred by Mr Aleen Campi for evaluation of the lower back subcutaneous mass. The patient states that the lesion has been present for many years. He states that the area has grown in size over the past year or so. It is now more noticeable. It can be seen through his shirt. He denies any weight loss. He denies any fevers or chills or night sweats. He denies any family history of soft tissue cancer. He denies any pain directly over the area. He states that he has had some intermittent left hip numbness.  He denies any weakness in his lower extremities. He is interested in having the area surgically removed. He denies any trauma or injury to the area. He denies any other soft tissue masses or lesions.  Past Medical History  Diagnosis Date  . HLD (hyperlipidemia)   . Headache   . Chest pain     cath 12/31/10: post AV CFX < 10% (no significant CAD), EF 60%   . Family history of ischemic heart disease   . Lipoma   . GERD (gastroesophageal reflux disease)     Past Surgical History  Procedure Date  . Cardiac catheterization 12/2010    Dr. Jearld Pies   . Stye surgery     Family History  Problem Relation Age of Onset  . Asthma Mother   . Mental illness Mother   . Heart disease Mother     died of CHF  . Arthritis Father   . Diabetes Father   . Heart disease Father     heart transplant, died of heart disease 5 years later  . Arthritis Paternal Aunt   . Diabetes Paternal Aunt   . Arthritis Paternal Uncle   . Diabetes Paternal Uncle   . Hypertension Brother   . Clotting disorder Maternal Aunt   . Cancer Maternal Grandmother   . Stroke Neg Hx     Social History History  Substance Use Topics  . Smoking status: Former Smoker -- 0.5 packs/day for 1  years    Quit date: 06/08/2006  . Smokeless tobacco: Not on file  . Alcohol Use: No    Allergies  Allergen Reactions  . Iodinated Diagnostic Agents     Current Outpatient Prescriptions  Medication Sig Dispense Refill  . lovastatin (MEVACOR) 20 MG tablet Take 1 tablet (20 mg total) by mouth at bedtime.  30 tablet  6  . clonazePAM (KLONOPIN) 0.5 MG tablet Take 1 tablet (0.5 mg total) by mouth at bedtime as needed for anxiety.  20 tablet  0    Review of Systems Review of Systems  Constitutional: Negative for fever, chills, appetite change and unexpected weight change.  HENT: Negative for congestion and trouble swallowing.   Eyes: Negative for visual disturbance.  Respiratory: Negative for chest tightness and shortness of breath.   Cardiovascular: Positive for palpitations (h/o palpitations. negative heart cath and normal event monitor last summer). Negative for chest pain and leg swelling.       No PND, no orthopnea, no DOE  Gastrointestinal: Negative for abdominal pain, diarrhea and constipation.       See HPI  Genitourinary: Negative for dysuria and hematuria.  Musculoskeletal: Negative.   Skin: Negative  for rash.  Neurological: Negative for seizures and speech difficulty.  Hematological: Negative for adenopathy. Does not bruise/bleed easily.  Psychiatric/Behavioral: Negative for behavioral problems and confusion.    Blood pressure 128/80, pulse 76, temperature 98.2 F (36.8 C), temperature source Temporal, resp. rate 14, height 6' (1.829 m), weight 228 lb 6.4 oz (103.602 kg).  Physical Exam Physical Exam  Vitals reviewed. Constitutional: He is oriented to person, place, and time. He appears well-developed and well-nourished. No distress.  HENT:  Head: Normocephalic and atraumatic.  Right Ear: External ear normal.  Left Ear: External ear normal.  Eyes: Conjunctivae are normal. No scleral icterus.  Neck: Normal range of motion. Neck supple. No tracheal deviation present.  No thyromegaly present.  Cardiovascular: Normal rate, regular rhythm and normal heart sounds.   Pulmonary/Chest: Effort normal and breath sounds normal. No stridor. No respiratory distress. He has no wheezes.  Abdominal: Soft. He exhibits no distension.  Musculoskeletal: Normal range of motion. He exhibits no edema and no tenderness.  Lymphadenopathy:    He has no cervical adenopathy.  Neurological: He is alert and oriented to person, place, and time. He exhibits normal muscle tone.  Skin: Skin is warm and dry. He is not diaphoretic.          Lower back slightly to left of spine - well circumscribed, soft, nontender, subcu mass about 7 x 8 cm. No  Overlying skin lesion. mobile  Psychiatric: He has a normal mood and affect. His behavior is normal. Judgment and thought content normal.    Data Reviewed LHC 2012 - normal - no sig CAD, normal EF Heart event monitor report from 02/2011 - occasional PVC Labs from yearly physical 09/2011 - normal bmet, cbc Mr Tysinger's note from 09/2011  Assessment    Lower back lipoma    Plan    We discussed the etiology and management of lipomas. The patient was given educational material. We discussed that the majority of lipomas are benign although on a rare occasion it can be malignant.   We discussed observation versus surgical excision. We discussed the risks and benefits of surgery including but not limited to bleeding, infection, injury to surrounding structures, scarring, cosmetic concerns, possible temporary drain placement, blood clot formation, anesthesia issues, possible recurrence, and the typical postoperative course.   The patient has elected to proceed to the operating room for EXCISION OF LOWER BACK LIPOMA  Mary Sella. Andrey Campanile, MD, FACS General, Bariatric, & Minimally Invasive Surgery [facility], [place]        [facility] M 11/25/2011, 10:28 AM

## 2011-11-25 NOTE — Patient Instructions (Signed)
Lipoma A lipoma is a noncancerous (benign) tumor composed of fat cells. They are usually found under the skin (subcutaneous). A lipoma may occur in any tissue of the body that contains fat. Common areas for lipomas to appear include the back, shoulders, buttocks, and thighs. Lipomas are a very common soft tissue growth. They are soft and grow slowly. Most problems caused by a lipoma depend on where it is growing. DIAGNOSIS  A lipoma can be diagnosed with a physical exam. These tumors rarely become cancerous, but radiographic studies can help determine this for certain. Studies used may include:  Computerized X-ray scans (CT or CAT scan).   Computerized magnetic scans (MRI).  TREATMENT  Small lipomas that are not causing problems may be watched. If a lipoma continues to enlarge or causes problems, removal is often the best treatment. Lipomas can also be removed to improve appearance. Surgery is done to remove the fatty cells and the surrounding capsule. Most often, this is done with medicine that numbs the area (local anesthetic). The removed tissue is examined under a microscope to make sure it is not cancerous. Keep all follow-up appointments with your caregiver. SEEK MEDICAL CARE IF:   The lipoma becomes larger or hard.   The lipoma becomes painful, red, or increasingly swollen. These could be signs of infection or a more serious condition.  Document Released: 05/15/2002 Document Revised: 05/14/2011 Document Reviewed: 10/25/2009 [employer] Patient Information 2012 [employer]. 

## 2011-12-03 ENCOUNTER — Ambulatory Visit (INDEPENDENT_AMBULATORY_CARE_PROVIDER_SITE_OTHER): Payer: 59 | Admitting: Nurse Practitioner

## 2011-12-03 ENCOUNTER — Encounter: Payer: Self-pay | Admitting: Nurse Practitioner

## 2011-12-03 VITALS — BP 116/84 | HR 72 | Ht 72.0 in | Wt 226.0 lb

## 2011-12-03 DIAGNOSIS — R002 Palpitations: Secondary | ICD-10-CM

## 2011-12-03 LAB — BASIC METABOLIC PANEL
BUN: 15 mg/dL (ref 6–23)
CO2: 28 mEq/L (ref 19–32)
Calcium: 8.8 mg/dL (ref 8.4–10.5)
Chloride: 105 mEq/L (ref 96–112)
Creatinine, Ser: 1.1 mg/dL (ref 0.4–1.5)
GFR: 96.13 mL/min (ref >60.00)
Glucose, Bld: 86 mg/dL (ref 70–99)
Potassium: 3.9 mEq/L (ref 3.5–5.1)
Sodium: 138 mEq/L (ref 135–145)

## 2011-12-03 LAB — TSH: TSH: 0.99 u[IU]/mL (ref 0.35–5.50)

## 2011-12-03 MED ORDER — PROPRANOLOL HCL 10 MG PO TABS: 10.0000 mg | ORAL_TABLET | Freq: Three times a day (TID) | ORAL | Status: DC

## 2011-12-03 NOTE — Assessment & Plan Note (Signed)
Patient presents with several complaints, but mostly with palpitations. He has had a normal echo and a prior event monitor showing PVC's. Has never tried beta blocker therapy. Will try some low dose Inderal 10 mg TID prn. Encouraged OTC Prilosec for his indigestion. Exercise is strongly encouraged along with caffeine restriction. We will check a TSH and BMET today. I will see him back in one month. Patient is agreeable to this plan and will call if any problems develop in the interim.

## 2011-12-03 NOTE — Patient Instructions (Signed)
We will try you on some Inderal 10 mg to take up to 3 times a day as needed for palpitations  Try some OTC Prilosec for your indigestion.   We are going to check labs today.  I will see you in a month.  Call the [facility] office at [phone] if you have any questions, problems or concerns.

## 2011-12-03 NOTE — Progress Notes (Signed)
Aaron Patel Date of Birth: 1966-01-18 Medical Record #[id number]  History of Present Illness: Aaron Patel is seen today for a work in visit. He is seen for Dr. Shirlee Latch. He has no significant CAD per cath back last July with an EF 60%. Has had palpitations and has had prior event monitor showing PVC's but no concerning findings. He has HLD and is on statin therapy. He has had a negative sleep study last September. His last visit here was in September.   He comes in today. He is here alone. He says he has had palpitations off and on since his last visit. For the past 1 and 1/2 months he has had more trouble sleeping due to the palpitations. Notes that his hands go to sleep if he lies prone and can feel his heart beating harder. Now on sleep agent to help him fall asleep. Not exercising. Caffeine use is questionable. Not really lightheaded or dizzy. Some indigestion reported and that seems to make him have more palpitations. He remains overweight. BMI is 31. Says he is not overly stressed but has been very "busy".   Current Outpatient Prescriptions on File Prior to Visit  Medication Sig Dispense Refill  . clonazePAM (KLONOPIN) 0.5 MG tablet Take 1 tablet (0.5 mg total) by mouth at bedtime as needed for anxiety.  20 tablet  0  . lovastatin (MEVACOR) 20 MG tablet Take 1 tablet (20 mg total) by mouth at bedtime.  30 tablet  6    Allergies  Allergen Reactions  . Iodinated Diagnostic Agents     Past Medical History  Diagnosis Date  . HLD (hyperlipidemia)   . Headache   . Chest pain     cath 12/31/10: post AV CFX < 10% (no significant CAD), EF 60%   . Family history of ischemic heart disease   . Lipoma   . GERD (gastroesophageal reflux disease)     Past Surgical History  Procedure Date  . Cardiac catheterization 12/2010    Dr. Jearld Pies   . Stye surgery     History  Smoking status  . Former Smoker -- 0.5 packs/day for 1 years  . Quit date: 06/08/2006  Smokeless tobacco  . Not on  file    History  Alcohol Use No    Family History  Problem Relation Age of Onset  . Asthma Mother   . Mental illness Mother   . Heart disease Mother     died of CHF  . Arthritis Father   . Diabetes Father   . Heart disease Father     heart transplant, died of heart disease 5 years later  . Arthritis Paternal Aunt   . Diabetes Paternal Aunt   . Arthritis Paternal Uncle   . Diabetes Paternal Uncle   . Hypertension Brother   . Clotting disorder Maternal Aunt   . Cancer Maternal Grandmother   . Stroke Neg Hx     Review of Systems: The review of systems is per the HPI.  All other systems were reviewed and are negative.  Physical Exam: There were no vitals taken for this visit. Patient is very pleasant and in no acute distress. Skin is warm and dry. Color is normal.  HEENT is unremarkable. Normocephalic/atraumatic. PERRL. Sclera are nonicteric. Neck is supple. No masses. No JVD. Lungs are clear. Cardiac exam shows a regular rate and rhythm. Abdomen is soft. Extremities are without edema. Gait and ROM are intact. No gross neurologic deficits noted.  LABORATORY DATA: EKG  today shows sinus rhythm and is normal.    Assessment / Plan:

## 2012-01-04 ENCOUNTER — Ambulatory Visit (INDEPENDENT_AMBULATORY_CARE_PROVIDER_SITE_OTHER): Payer: Self-pay | Admitting: Nurse Practitioner

## 2012-01-04 ENCOUNTER — Encounter: Payer: Self-pay | Admitting: Nurse Practitioner

## 2012-01-04 VITALS — BP 132/80 | HR 70 | Ht 72.0 in | Wt 219.0 lb

## 2012-01-04 DIAGNOSIS — R002 Palpitations: Secondary | ICD-10-CM

## 2012-01-04 NOTE — Patient Instructions (Addendum)
I would like for you to follow up with Dr. Susann Givens about your sleeping problems  We will see you back in about 4 months  Call the [facility] office at [phone] if you have any questions, problems or concerns.

## 2012-01-04 NOTE — Progress Notes (Signed)
Golden Hurter Date of Birth: 26-Oct-1965 Medical Record #[id number]  History of Present Illness:  Mr. Aaron Patel is seen today for a follow up visit. It is a one month check. He is seen for Dr. Shirlee Latch. He has no significant CAD per cath back one year ago with an EF of 60%. He has had palpitations and a prior event monitor showing PVCs but overall, no concerning findings. He has HLD and is on statin therapy. He has had a negative sleep study last September. I saw him a month ago with complaints of palpitations. Caffeine use was questionable. Having more issues with the palpitations at night and when he has indigestion. I started him on prn Inderal, encouraged caffeine restriction and OTC Prilosec. TSH and BMET were normal.   He comes in today. He is here with one of his children. He is doing ok. Still having palpitations. Happens at night. Having insomnia and just can't stay asleep at night. Klonopin not working for him. Inderal does not really help him. No chest pain. Has cut out his caffeine. Has lost 7 pounds. Not exercising regularly. Not lightheaded or dizzy. No problems during the course of the day.    Current Outpatient Prescriptions on File Prior to Visit  Medication Sig Dispense Refill  . clonazePAM (KLONOPIN) 0.5 MG tablet Take 1 tablet (0.5 mg total) by mouth at bedtime as needed for anxiety.  20 tablet  0  . lovastatin (MEVACOR) 20 MG tablet Take 1 tablet (20 mg total) by mouth at bedtime.  30 tablet  6  . propranolol (INDERAL) 10 MG tablet Take 1 tablet (10 mg total) by mouth 3 (three) times daily.  90 tablet  6    Allergies  Allergen Reactions  . Iodinated Diagnostic Agents     Past Medical History  Diagnosis Date  . HLD (hyperlipidemia)   . Headache   . Chest pain     cath 12/31/10: post AV CFX < 10% (no significant CAD), EF 60%   . Family history of ischemic heart disease   . Lipoma   . GERD (gastroesophageal reflux disease)   . Palpitations   . Hx of echocardiogram Aug  2012    Normal    Past Surgical History  Procedure Date  . Cardiac catheterization 12/2010    Dr. Jearld Pies   . Stye surgery     History  Smoking status  . Former Smoker -- 0.5 packs/day for 1 years  . Quit date: 06/08/2006  Smokeless tobacco  . Not on file    History  Alcohol Use  . 0.6 oz/week  . 1 Cans of beer per week    occasionally    Family History  Problem Relation Age of Onset  . Asthma Mother   . Mental illness Mother   . Heart disease Mother     died of CHF  . Arthritis Father   . Diabetes Father   . Heart disease Father     heart transplant, died of heart disease 5 years later  . Arthritis Paternal Aunt   . Diabetes Paternal Aunt   . Arthritis Paternal Uncle   . Diabetes Paternal Uncle   . Hypertension Brother   . Clotting disorder Maternal Aunt   . Cancer Maternal Grandmother   . Stroke Neg Hx     Review of Systems: The review of systems is per the HPI.  All other systems were reviewed and are negative.  Physical Exam: BP 132/80  Pulse 70  Ht  6' (1.829 m)  Wt 219 lb (99.338 kg)  BMI 29.70 kg/m2 Patient is very pleasant and in no acute distress. Skin is warm and dry. Color is normal.  HEENT is unremarkable. Normocephalic/atraumatic. PERRL. Sclera are nonicteric. Neck is supple. No masses. No JVD. Lungs are clear. Cardiac exam shows a regular rate and rhythm. Abdomen is soft. Extremities are without edema. Gait and ROM are intact. No gross neurologic deficits noted.   LABORATORY DATA:   Lab Results  Component Value Date   WBC 5.1 09/07/2011   HGB 14.8 09/07/2011   HCT 44.2 09/07/2011   PLT 296 09/07/2011   GLUCOSE 86 12/03/2011   CHOL 182 09/07/2011   TRIG 74 09/07/2011   HDL 44 09/07/2011   LDLCALC 409* 09/07/2011   ALT 22 09/07/2011   AST 20 09/07/2011   NA 138 12/03/2011   K 3.9 12/03/2011   CL 105 12/03/2011   CREATININE 1.1 12/03/2011   BUN 15 12/03/2011   CO2 28 12/03/2011   TSH 0.99 12/03/2011   PSA 0.70 09/07/2011     Assessment / Plan:

## 2012-01-04 NOTE — Assessment & Plan Note (Signed)
I offered to switch him over to some Toprol. He feels like that if he can get his sleep issues addressed, then he will be fine. I have asked him to touch base with his primary care provider regarding his sleeping issues. From our standpoint, he is felt to be stable. Will see him back in about 4 months. Patient is agreeable to this plan and will call if any problems develop in the interim.

## 2012-01-05 ENCOUNTER — Telehealth: Payer: Self-pay | Admitting: Medical

## 2012-01-05 NOTE — Telephone Encounter (Signed)
PT STATES WENT TO SEE CARDIO TODAY. ALSO WANTED YOU TO KNOW KLONOPIN IS HELPING WITH HIS SLEEP ISSUES .

## 2012-01-05 NOTE — Telephone Encounter (Signed)
i reviewed the cardiology notes which mentioned sleep NOT improved.  Have him f/u in a few weeks regarding sleep issues to make sure he c/t to have improvement with this.

## 2012-01-06 ENCOUNTER — Other Ambulatory Visit: Payer: Self-pay | Admitting: Medical

## 2012-01-06 MED ORDER — CLONAZEPAM 0.5 MG PO TABS: 0.5000 mg | ORAL_TABLET | Freq: Every evening | ORAL | Status: DC | PRN

## 2012-01-06 NOTE — Telephone Encounter (Signed)
Patient states that he needs a refill on his sleep medication. CLS   Patient was notified to follow up with Crosby Oyster PA-C in a few weeks regarding his sleep issues. CLS

## 2012-01-06 NOTE — Progress Notes (Signed)
Rx was called out to his pharmacy. CLS

## 2012-03-02 ENCOUNTER — Ambulatory Visit (INDEPENDENT_AMBULATORY_CARE_PROVIDER_SITE_OTHER): Payer: BC Managed Care – PPO | Admitting: Cardiology

## 2012-03-02 ENCOUNTER — Encounter: Payer: Self-pay | Admitting: Cardiology

## 2012-03-02 VITALS — BP 130/82 | HR 85 | Resp 18 | Ht 72.0 in | Wt 226.8 lb

## 2012-03-02 DIAGNOSIS — R079 Chest pain, unspecified: Secondary | ICD-10-CM

## 2012-03-02 DIAGNOSIS — R002 Palpitations: Secondary | ICD-10-CM

## 2012-03-02 MED ORDER — OMEPRAZOLE 20 MG PO CPDR: 20.0000 mg | DELAYED_RELEASE_CAPSULE | Freq: Every day | ORAL | Status: DC

## 2012-03-02 NOTE — Patient Instructions (Addendum)
Try omeprazole 20mg  daily for your chest discomfort /epigastric pain.  If your symptoms are not better in 2 weeks you should call your primary care doctor. If this does help your symptoms, you should ask your primary care doctor for refills. You do not need to schedule a follow-up appointment with Dr Shirlee Latch.

## 2012-03-03 NOTE — Progress Notes (Signed)
Patient ID: Aaron Gerrard., male   DOB: [birth year], 46 y.o.   MRN: [id number] PCP: Dr. Susann Givens  46 yo with minimal past history was admitted to [facility] in 7/12 with chest pain.  He had one troponin that returned abnormal at 0.33.  This was concerning for a non-ST elevation myocardial infarction.  He was taken to the cardiac catheterization lab.  This demonstrated no significant CAD.  He had an EF of 60%. Echo was done in 8/12, showing EF 60-65%, normal study.  He was seen by Tereso Newcomer in the office, and given history of palpitations, was given an event monitor to assess for arrhythmias.  The monitor showed occasional PVCs, otherwise no concerning findings.  He had a sleep study done that did not show significant OSA . He has continued to have occasional palpitations, currently not bothersome.    His main concern today is epigastric fullness.  Sometimes this will radiate up to his lower chest like a gas bubble.  It has been going on for two weeks and has been constant, but waxes/wanes.  It is worse with eating or lying down.   He feels like he cannot take in a full breath.  He has no hematemesis or BRBPR.  No melena.  Symptoms are actually better if he exercises.  No NSAID use.    Labs (6/13): TSH normal, LDL 123  Past Medical History  Diagnosis Date  . HLD (hyperlipidemia)   . Headache   . Chest pain     cath 12/31/10: post AV CFX < 10% (no significant CAD), EF 60%   . Family history of ischemic heart disease   . Lipoma   . GERD (gastroesophageal reflux disease)   . Palpitations: Occasional PVCs on monitor.    Marland Kitchen Hx of echocardiogram Aug 2012    Normal  - Sleep study in 10/12 with no evidence for OSA.   Current Outpatient Prescriptions  Medication Sig Dispense Refill  . aspirin 81 MG tablet Take 81 mg by mouth 3 (three) times daily.      . clonazePAM (KLONOPIN) 0.5 MG tablet Take 1 tablet (0.5 mg total) by mouth at bedtime as needed for anxiety.  20 tablet  0  . lovastatin (MEVACOR) 20 MG  tablet Take 1 tablet (20 mg total) by mouth at bedtime.  30 tablet  6  . omeprazole (PRILOSEC) 20 MG capsule Take 1 capsule (20 mg total) by mouth daily.  30 capsule  0    Allergies: Allergies  Allergen Reactions  . 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  ROS:  Please see the history of present illness.  All other systems reviewed and negative.  Vital Signs: BP 130/82  Pulse 85  Resp 18  Ht 6' (1.829 m)  Wt 226 lb 12.8 oz (102.876 kg)  BMI 30.76 kg/m2  SpO2 97%  PHYSICAL EXAM: Well nourished, well developed, in no acute distress HEENT: normal Neck: no JVD Endocrine: No thyromegaly Cardiac:  normal S1, S2; RRR; no murmur Lungs:  clear to auscultation bilaterally, no wheezing, rhonchi or rales Abd: soft, nontender, no hepatomegaly Ext: no edema; Right radial site without hematoma   Assessment/Plan: 1. Epigastric discomfort: I do not think this is cardiac related.  It is worse with food or lying down.  Differential includes GERD, PUD.  I will have him take omeprazole  20 mg daily.  If he sees no improvement, would have him contact his PCP for referral to GI.   2. Palpitations: Occasional PVCs on prior monitoring.  Not particularly symptomatic.    Prn followup.   Kwame Ryland [employer]

## 2012-03-28 ENCOUNTER — Ambulatory Visit: Payer: 59 | Admitting: Nurse Practitioner

## 2012-04-27 ENCOUNTER — Ambulatory Visit: Payer: BC Managed Care – PPO | Admitting: Cardiology

## 2012-07-22 ENCOUNTER — Other Ambulatory Visit: Payer: Self-pay | Admitting: Family Medicine

## 2012-07-22 MED ORDER — LOVASTATIN 20 MG PO TABS: 20.0000 mg | ORAL_TABLET | Freq: Every day | ORAL | Status: DC

## 2012-07-22 NOTE — Telephone Encounter (Signed)
Needs lovastatin  [facility]

## 2012-08-02 ENCOUNTER — Encounter: Payer: Self-pay | Admitting: Family Medicine

## 2012-08-02 ENCOUNTER — Ambulatory Visit (INDEPENDENT_AMBULATORY_CARE_PROVIDER_SITE_OTHER): Payer: BC Managed Care – PPO | Admitting: Family Medicine

## 2012-08-02 VITALS — BP 110/72 | HR 78 | Wt 222.0 lb

## 2012-08-02 DIAGNOSIS — S76302A Unspecified injury of muscle, fascia and tendon of the posterior muscle group at thigh level, left thigh, initial encounter: Secondary | ICD-10-CM

## 2012-08-02 NOTE — Progress Notes (Signed)
  Subjective:    Patient ID: Aaron Human., male    DOB: 1966-03-15, 47 y.o.   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 Since then he has had difficulty with pain as well as with walking. He is scheduled to refereeing games in the near future.   Review of Systems     Objective:   Physical Exam Exam of the left biceps femoris shows slight swelling and tenderness to the distal medial head of the biceps. No ecchymosis is noted.       Assessment & Plan:  Hamstring injury, left, initial encounter recommend heat, stretching and wrapping this. Discussed increasing his physical activities based on pain starting with walking than jogging and then have speed and full speed. Strongly disc her temp from refereeing again within the next week.

## 2013-01-17 ENCOUNTER — Ambulatory Visit (INDEPENDENT_AMBULATORY_CARE_PROVIDER_SITE_OTHER): Payer: BC Managed Care – PPO | Admitting: Medical

## 2013-01-17 ENCOUNTER — Encounter: Payer: Self-pay | Admitting: Medical

## 2013-01-17 ENCOUNTER — Telehealth: Payer: Self-pay | Admitting: Family Medicine

## 2013-01-17 VITALS — BP 118/80 | HR 80 | Temp 98.2°F | Resp 18 | Ht 71.0 in | Wt 232.0 lb

## 2013-01-17 DIAGNOSIS — E785 Hyperlipidemia, unspecified: Secondary | ICD-10-CM

## 2013-01-17 DIAGNOSIS — Z0000 Encounter for general adult medical examination without abnormal findings: Secondary | ICD-10-CM

## 2013-01-17 DIAGNOSIS — R0602 Shortness of breath: Secondary | ICD-10-CM

## 2013-01-17 DIAGNOSIS — K219 Gastro-esophageal reflux disease without esophagitis: Secondary | ICD-10-CM

## 2013-01-17 DIAGNOSIS — R002 Palpitations: Secondary | ICD-10-CM

## 2013-01-17 LAB — CBC WITH DIFFERENTIAL/PLATELET
Basophils Absolute: 0 10*3/uL (ref 0.0–0.1)
Basophils Relative: 1 % (ref 0–1)
Eosinophils Absolute: 0.2 10*3/uL (ref 0.0–0.7)
Lymphs Abs: 1.8 10*3/uL (ref 0.7–4.0)
MCH: 30 pg (ref 26.0–34.0)
MCHC: 34.4 g/dL (ref 30.0–36.0)
Neutrophils Relative %: 59 % (ref 43–77)
Platelets: 321 10*3/uL (ref 150–400)
RBC: 5.14 MIL/uL (ref 4.22–5.81)
RDW: 14.1 % (ref 11.5–15.5)

## 2013-01-17 LAB — POCT URINALYSIS DIPSTICK
Bilirubin, UA: NEGATIVE
Blood, UA: NEGATIVE
Leukocytes, UA: NEGATIVE
Nitrite, UA: NEGATIVE
Urobilinogen, UA: NEGATIVE
pH, UA: 5

## 2013-01-17 NOTE — Telephone Encounter (Signed)
Patient is aware of his PFT appointment on 01/19/13 @ 100 pm. CLS [id number]

## 2013-01-17 NOTE — Progress Notes (Signed)
Subjective:   HPI  Aaron Patel. is a 47 y.o. male who presents for a complete physical.  Last physical 6/13.    Medical care team includes:  Dr. Gaynelle Adu, surgeon, pending lipoma excision of back  Cardiology, Dr. Marca Ancona  Urology  Primary care here with me and Dr. Susann Givens   Preventative care: Last ophthalmology visit:n/a Last dental visit:yes- Corinne smiles Last colonoscopy:n/a Last prostate exam: 6/13 Last EKG:05/2010 Last labs:11/2011  Prior vaccinations: TD or Tdap:09/2011 Influenza:2011 Pneumococcal:n/a Shingles/Zostavax:n/a  Advanced directive:n/a Health care power of attorney:n/a Living will:n/a  Concerns: Feels at times that he can't get full breath, or at times feels SOB.  Saw cardiology again last year for palpitations, chest discomfort.  Was put back on omeprazole for a while.   Reviewed their medical, surgical, family, social, medication, and allergy history and updated chart as appropriate.  Past Medical History  Diagnosis Date  . HLD (hyperlipidemia)   . Headache(784.0)   . Chest pain     cath 12/31/10: post AV CFX < 10% (no significant CAD), EF 60%   . Family history of ischemic heart disease   . Lipoma     Dr. Gaynelle Adu, consult 2013, pending surgery  . Palpitations     recheck with cardiology 02/2012.  Marland Kitchen Hx of echocardiogram Aug 2012    Normal  . GERD (gastroesophageal reflux disease)     intermittent  . Peyronie disease     Urology consult prior    Past Surgical History  Procedure Laterality Date  . Cardiac catheterization  12/2010    Dr. Jearld Pies   . Stye surgery      History   Social History  . Marital Status: Married    Spouse Name: N/A    Number of Children: N/A  . Years of Education: N/A   Occupational History  . 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 Smoking status: Former Smoker -- 0.00 packs/day for .5 years    Quit date: 06/08/2006  .  Smokeless tobacco: Not on file  . Alcohol Use: 0.6 oz/week    1 Shots of liquor per week     Comment: occasionally  . Drug Use: No  . Sexually Active: Yes   Other Topics Concern  . Not on file   Social History Narrative   Basketball, coaches basketball, married, 2 children both teenagers.  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  Exercise - officiate basketball,running up and down the court.  [employer].    Family History  Problem Relation Age of Onset  . Asthma Mother   . Mental illness Mother   . Heart disease Mother     died of CHF  . Arthritis Father   . Diabetes Father   . Heart disease Father     heart transplant, died of heart disease 5 years later  . Arthritis Paternal Aunt   . Diabetes Paternal Aunt   . Arthritis Paternal Uncle   . Diabetes Paternal Uncle   . Hypertension Brother   . Clotting disorder Maternal Aunt   . Cancer Maternal Grandmother   . Stroke Neg Hx     Current outpatient prescriptions:lovastatin (MEVACOR) 20 MG tablet, Take 1 tablet (20 mg total) by mouth at bedtime., Disp: 30 tablet, Rfl: 2;  aspirin 81 MG tablet, Take 81 mg by mouth 3 (three) times daily., Disp: , Rfl: ;  clonazePAM (KLONOPIN) 0.5 MG tablet, Take 1 tablet (0.5  mg total) by mouth at bedtime as needed for anxiety., Disp: 20 tablet, Rfl: 0  Allergies  Allergen Reactions  . Iodinated Diagnostic Agents        Review of Systems Constitutional: -fever, -chills, -sweats, +unexpected weight change, -decreased appetite, -fatigue Allergy: -sneezing, -itching, -congestion Dermatology: -+ moles, --rash, +lumps ENT: -runny nose, -ear pain, -sore throat, -hoarseness, +sinus pain, -teeth pain, - ringing in ears, -hearing loss, -nosebleeds Cardiology: -chest pain, -palpitations, -swelling, -difficulty breathing when lying flat, -waking up short of breath Respiratory: -cough, +intermittent shortness of breath, -difficulty breathing with exercise or exertion, -wheezing, -coughing up blood Gastroenterology:  -abdominal pain, -nausea, -vomiting, -diarrhea, -constipation, -blood in stool, -changes in bowel movement, -difficulty swallowing or eating Hematology: -bleeding, -bruising  Musculoskeletal: -joint aches, -muscle aches, -joint swelling, -back pain, -neck pain, -cramping, -changes in gait Ophthalmology: denies vision changes, eye redness, itching, discharge Urology: -burning with urination, -difficulty urinating, -blood in urine, -urinary frequency, -urgency, -incontinence Neurology: +headache, -weakness, -tingling, -numbness, -memory loss, -falls, -dizziness Psychology: -depressed mood, -agitation, -sleep problems     Objective:   Physical Exam  Filed Vitals:   01/17/13 1002  BP: 118/80  Pulse: 80  Temp: 98.2 F (36.8 C)    General appearance: alert, no distress, WD/WN, african [place] male  Skin: left shoulder with slightly raised round brown 8mm lesion unchanged for years per patient, few small 2-3 mm papular lesions of the face, benign-appearing, unchanged for years per patient, other scattered benign appearing macules .  Large lipoma of left low back. HEENT: normocephalic, conjunctiva/corneas normal, sclerae anicteric, PERRLA, EOMi, nares patent, no discharge or erythema, pharynx normal  Oral cavity: MMM, tongue normal, teeth in good repair  Neck: supple, no lymphadenopathy, no thyromegaly, no masses, normal ROM, no bruits  Chest: non tender, normal shape and expansion  Heart: RRR, normal S1, S2, no murmurs  Lungs: CTA bilaterally, no wheezes, rhonchi, or rales  Abdomen: +bs, soft, non tender, non distended, no masses, no hepatomegaly, no splenomegaly, no bruits  Back: non tender, normal ROM, no scoliosis  Musculoskeletal: upper extremities non tender, no obvious deformity, normal ROM throughout, lower extremities non tender, no obvious deformity, normal ROM throughout  Extremities: no edema, no cyanosis, no clubbing  Pulses: 2+ symmetric, upper and lower extremities, normal cap  refill  Neurological: alert, oriented x 3, CN2-12 intact, strength normal upper extremities and lower extremities, sensation normal throughout, DTRs 2+ throughout, no cerebellar signs, gait normal  Psychiatric: normal affect, behavior normal, pleasant  GU: deep fibrous tissue along shaft of penis, c/w peryone's disease, otherwise normal male external genitalia, nontender, 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  . Routine general medical examination at a health care facility Yes  . SOB (shortness of breath)   . GERD (gastroesophageal reflux disease)   . Palpitations   . Hyperlipidemia     Physical exam - discussed healthy lifestyle, diet, exercise, preventative care, vaccinations, and addressed their concerns.  Advised he see eye doctor soon for baseline eval.   F/u with general surgery regarding lipoma.  SOB - doubt any significant issue, likely more perception of symptoms than any real issues.  Will set up CXR and PFTs.  GERD/palpitations - intermittent hx/o. reviewed cardiology notes from 2013.  He saw cardiology again this past year for  palpitations.  They felt that symptoms were GERD related.  We discussed GERD triggers, strategies to reduce GERD flare ups.  Discussed stress reduction, regular exercise.   If GERD symptoms continue despite using Omeprazole, consider referral for EGD.  hyperlipidemia - compliant with medication, labs today.     Follow-up pending studies

## 2013-01-18 ENCOUNTER — Encounter: Payer: Self-pay | Admitting: Family Medicine

## 2013-01-18 LAB — COMPREHENSIVE METABOLIC PANEL
ALT: 26 U/L (ref 0–53)
Albumin: 4.3 g/dL (ref 3.5–5.2)
Alkaline Phosphatase: 88 U/L (ref 39–117)
Glucose, Bld: 90 mg/dL (ref 70–99)
Potassium: 4.3 mEq/L (ref 3.5–5.3)
Sodium: 136 mEq/L (ref 135–145)
Total Protein: 7 g/dL (ref 6.0–8.3)

## 2013-01-18 LAB — LIPID PANEL: Cholesterol: 201 mg/dL — ABNORMAL HIGH (ref 0–200)

## 2013-01-18 LAB — PSA: PSA: 0.76 ng/mL (ref <=4.00)

## 2013-01-19 ENCOUNTER — Ambulatory Visit (INDEPENDENT_AMBULATORY_CARE_PROVIDER_SITE_OTHER): Payer: BC Managed Care – PPO | Admitting: Internal Medicine

## 2013-01-19 DIAGNOSIS — R0602 Shortness of breath: Secondary | ICD-10-CM

## 2013-01-19 LAB — PULMONARY FUNCTION TEST

## 2013-01-19 NOTE — Progress Notes (Signed)
PFT done today. 

## 2013-01-23 ENCOUNTER — Encounter: Payer: Self-pay | Admitting: Family Medicine

## 2013-02-07 ENCOUNTER — Telehealth: Payer: Self-pay | Admitting: Internal Medicine

## 2013-02-07 ENCOUNTER — Other Ambulatory Visit: Payer: Self-pay | Admitting: Family Medicine

## 2013-02-07 MED ORDER — LOVASTATIN 20 MG PO TABS: ORAL_TABLET | ORAL | Status: DC

## 2013-02-07 NOTE — Telephone Encounter (Signed)
Sent in 90 day supply

## 2013-02-07 NOTE — Telephone Encounter (Signed)
Pt needs a refill for lovastatin 20mg  for a 90 day supply at a time to [facility]

## 2013-02-09 ENCOUNTER — Ambulatory Visit: Payer: BC Managed Care – PPO | Admitting: Family Medicine

## 2013-02-10 ENCOUNTER — Other Ambulatory Visit: Payer: Self-pay

## 2013-02-10 ENCOUNTER — Encounter: Payer: Self-pay | Admitting: Family Medicine

## 2013-02-10 ENCOUNTER — Ambulatory Visit (INDEPENDENT_AMBULATORY_CARE_PROVIDER_SITE_OTHER): Payer: BC Managed Care – PPO | Admitting: Family Medicine

## 2013-02-10 VITALS — BP 128/80 | HR 94 | Wt 235.0 lb

## 2013-02-10 DIAGNOSIS — R51 Headache: Secondary | ICD-10-CM

## 2013-02-10 DIAGNOSIS — J309 Allergic rhinitis, unspecified: Secondary | ICD-10-CM

## 2013-02-10 MED ORDER — FLUTICASONE PROPIONATE 50 MCG/ACT NA SUSP: 2.0000 | Freq: Every day | NASAL | Status: DC

## 2013-02-10 NOTE — Progress Notes (Signed)
  Subjective:    Patient ID: Aaron Human., male    DOB: [birth year], 47 y.o.   MRN: [id number]  HPI One month ago he noted the onset of headaches that would wake him from sleep. The headaches are constant and in the mid forehead area and occasionally go to the left or right parietal area . He would usually get out of bed, use warm compresses and ibuprofen 6-800 mg with results. He is now getting headaches sometimes during the day and again mainly in the frontal. Rexall sinus medicine prevents this. It has fetal Afrin and Tylenol in it. He does not smoke.  Review of Systems     Objective:   Physical Exam alert and in no distress. Tympanic membranes and canals are normal. Throat is clear. Tonsils are normal. Neck is supple without adenopathy or thyromegaly. Cardiac exam shows a regular sinus rhythm without murmurs or gallops. Lungs are clear to auscultation. As the mucosa is normal. No tenderness over sinuses.       Assessment & Plan:  Sinus headache  Allergic rhinitis due to allergen  symptoms are most consistent with a sinus headache from underlying allergies although he is only having congestion. I will recommend that he switch to [facility] and eventually stop the allergy medicine that he is taking OTC.

## 2013-02-10 NOTE — Patient Instructions (Signed)
Use  Flonase nasal spray  until we have a good hard frost.

## 2013-02-13 ENCOUNTER — Telehealth: Payer: Self-pay | Admitting: Family Medicine

## 2013-02-13 NOTE — Telephone Encounter (Signed)
LMOM TO CB. CLS 

## 2013-02-13 NOTE — Telephone Encounter (Signed)
Message copied by Janeice Robinson on Mon Feb 13, 2013  2:39 PM ------      Message from: Aleen Campi, Kermit Balo      Created: Fri Feb 03, 2013  7:25 AM       His PFT/lung studies were basically normal.  Given his symptoms concerns, have him get the Chest xray too to rule out anything else, but his SOB symptoms may be as much related to acid reflux than anything.  If he is still taking Omeprazole, we can try something stronger such as Nexium or Dexliant for 2-3 weeks and see if this makes any difference .             Thus, go for CXR (order in system) and have him come by and get samples if we have nexium or Dexilant.  Either are once daily 45 minutes prior to breakfast. ------

## 2013-02-13 NOTE — Telephone Encounter (Signed)
Patient states that he is not taking anything for acid reflux at this time. He said that he was going to go get the xray done. CLS  Patient is aware of the PFT results. CLS

## 2013-02-14 NOTE — Telephone Encounter (Signed)
Ok, I'll await CXR results

## 2013-04-03 ENCOUNTER — Telehealth: Payer: Self-pay | Admitting: Cardiology

## 2013-04-03 NOTE — Telephone Encounter (Signed)
New Problem    Pt is having elevated heart rate when lying down and SOB off and on.  appt 11/3

## 2013-04-03 NOTE — Telephone Encounter (Signed)
LMTCB

## 2013-04-10 ENCOUNTER — Ambulatory Visit (INDEPENDENT_AMBULATORY_CARE_PROVIDER_SITE_OTHER): Payer: BC Managed Care – PPO | Admitting: Cardiology

## 2013-04-10 ENCOUNTER — Encounter: Payer: Self-pay | Admitting: Cardiology

## 2013-04-10 VITALS — BP 110/80 | HR 69 | Ht 72.0 in | Wt 235.0 lb

## 2013-04-10 DIAGNOSIS — E785 Hyperlipidemia, unspecified: Secondary | ICD-10-CM

## 2013-04-10 DIAGNOSIS — R002 Palpitations: Secondary | ICD-10-CM

## 2013-04-10 DIAGNOSIS — R079 Chest pain, unspecified: Secondary | ICD-10-CM

## 2013-04-10 MED ORDER — LOVASTATIN 40 MG PO TABS: 40.0000 mg | ORAL_TABLET | Freq: Every day | ORAL | Status: DC

## 2013-04-10 NOTE — Patient Instructions (Signed)
Take aspirin 81mg  daily.   Try omeprazole 20mg  daily.  Increase lovastatin to 40mg  daily. You can take 2 of your 20mg  tablets daily at the same time and use your current supply.  Your physician has requested that you have an exercise tolerance test. For further information please visit [email or web address] Please also follow instruction sheet, as given.  Your physician recommends that you return for a FASTING lipid profile /liver profile in 2 months.   Your physician wants you to follow-up in: 1 year with Dr Shirlee Latch. (November 2015).  You will receive a reminder letter in the mail two months in advance. If you don't receive a letter, please call our office to schedule the follow-up appointment.

## 2013-04-11 NOTE — Progress Notes (Signed)
Patient ID: Aaron Patel., male   DOB: [birth year], 47 y.o.   MRN: [id number] PCP: Dr. Susann Givens  47 yo with minimal past history was admitted to [facility] in 7/12 with chest pain.  He had one troponin that returned abnormal at 0.33.  This was concerning for a non-ST elevation myocardial infarction.  He was taken to the cardiac catheterization lab.  This demonstrated no significant CAD.  He had an EF of 60%. Echo was done in 8/12, showing EF 60-65%, normal study.  He was later seen by Tereso Newcomer in the office, and given history of palpitations, was given an event monitor to assess for arrhythmias.  The monitor showed occasional PVCs, otherwise no concerning findings.  He had a sleep study done in 10/12 that did not show significant OSA .   Today, he is concerned about a sense of chest tightness/fullness that he has been having for about 2 weeks.  He has 3-4 episodes/day.  The episodes are nonexertional (seem completely random) and last for a few minutes.  No exertional chest pain, no exertional dyspnea.  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   ECG: NSR, normal  Labs (6/13): TSH normal, LDL 123 Labs (8/14): K 4.3, creatinine 1.01, LDL 131, HDL 55  PMH: 1. Hyperlipidemia 2. Chest pain: LHC (7/12) with no significant CAD and EF 60%.  Echo (7/12) with EF 60-65% normal study.  3. GERD 4. Palpitations: PVCs on prior event monitor.  5. Sleep study 10/12 with no OSA 6. PFTs (8/14) were essentially normal  Current Outpatient Prescriptions  Medication Sig Dispense Refill  . aspirin EC 81 MG tablet Take 1 tablet (81 mg total) by mouth daily.      Marland Kitchen lovastatin (MEVACOR) 40 MG tablet Take 1 tablet (40 mg total) by mouth at bedtime.  30 tablet  3  . omeprazole (PRILOSEC) 20 MG capsule Take 1 capsule (20 mg total) by mouth daily.       No current facility-administered medications for this visit.    Allergies: Allergies  Allergen Reactions   . 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  Family history: Mother with MI at 26, father with heart transplant at 33  ROS:  Please see the history of present illness.  All other systems reviewed and negative.  Vital Signs: BP 110/80  Pulse 69  Ht 6' (1.829 m)  Wt 106.595 kg (235 lb)  BMI 31.86 kg/m2  SpO2 96%  PHYSICAL EXAM: Well nourished, well developed, in no acute distress HEENT: normal Neck: no JVD Endocrine: No thyromegaly Cardiac:  normal S1, S2; RRR; no murmur.  Normal pedal pulses. No peripheral edema.  Lungs:  clear to auscultation bilaterally, no wheezing, rhonchi or rales Abd: soft, nontender, no hepatomegaly Ext: No clubbing/cyanosis  Assessment/Plan: 1. Chest pain: Atypical.  He had a normal cardiac cath in 2012.  It is possible that this is GERD.  He has a history of GERD and is not currently on a PPI.  He is worried because he is getting ready to start refereeing college basketball games.  I will arrange for an ETT.  Given his family history of CAD, he should take ASA 81 mg daily. As this could be GERD, I will have him restart omeprazole.  2. Hyperlipidemia: Given strong family history  of CAD, I would like to see LDL lower.  I will increase lovastatin to 40 mg daily with lipids/LFTs in 2 months.    Selim Durden 04/11/2013

## 2013-04-27 ENCOUNTER — Encounter (HOSPITAL_COMMUNITY): Payer: BC Managed Care – PPO

## 2013-05-10 ENCOUNTER — Ambulatory Visit (HOSPITAL_COMMUNITY)
Admission: RE | Admit: 2013-05-10 | Discharge: 2013-05-10 | Disposition: A | Discharge status: Home / Self Care | Payer: BC Managed Care – PPO | Source: Ambulatory Visit | Attending: Cardiology | Admitting: Cardiology

## 2013-05-10 DIAGNOSIS — R079 Chest pain, unspecified: Secondary | ICD-10-CM

## 2013-05-10 DIAGNOSIS — R002 Palpitations: Secondary | ICD-10-CM

## 2013-05-10 DIAGNOSIS — I4949 Other premature depolarization: Secondary | ICD-10-CM | POA: Insufficient documentation

## 2013-06-07 ENCOUNTER — Ambulatory Visit (INDEPENDENT_AMBULATORY_CARE_PROVIDER_SITE_OTHER): Payer: BC Managed Care – PPO | Admitting: Physician Assistant

## 2013-06-07 VITALS — BP 130/84 | HR 85 | Temp 99.1°F | Resp 16 | Ht 69.0 in | Wt 234.0 lb

## 2013-06-07 DIAGNOSIS — R509 Fever, unspecified: Secondary | ICD-10-CM

## 2013-06-07 DIAGNOSIS — J029 Acute pharyngitis, unspecified: Secondary | ICD-10-CM

## 2013-06-07 DIAGNOSIS — J020 Streptococcal pharyngitis: Secondary | ICD-10-CM

## 2013-06-07 LAB — POCT CBC
Granulocyte percent: 81 %G — AB (ref 37–80)
Lymph, poc: 1.7 (ref 0.6–3.4)
MCHC: 32 g/dL (ref 31.8–35.4)
MID (cbc): 1 — AB (ref 0–0.9)
MPV: 7.3 fL (ref 0–99.8)
POC Granulocyte: 11.5 — AB (ref 2–6.9)
POC LYMPH PERCENT: 11.9 %L (ref 10–50)
POC MID %: 7.1 %M (ref 0–12)
Platelet Count, POC: 316 10*3/uL (ref 142–424)
RDW, POC: 13.7 %

## 2013-06-07 LAB — POCT RAPID STREP A (OFFICE): Rapid Strep A Screen: NEGATIVE

## 2013-06-07 MED ORDER — AMOXICILLIN-POT CLAVULANATE 875-125 MG PO TABS: 1.0000 | ORAL_TABLET | Freq: Two times a day (BID) | ORAL | Status: DC

## 2013-06-07 MED ORDER — HYDROCODONE-ACETAMINOPHEN 5-325 MG PO TABS: 1.0000 | ORAL_TABLET | Freq: Four times a day (QID) | ORAL | Status: DC | PRN

## 2013-06-07 MED ORDER — CEFTRIAXONE SODIUM 1 G IJ SOLR
1.0000 g | Freq: Once | INTRAMUSCULAR | Status: AC
  Administered 2013-06-07: 1 g via INTRAMUSCULAR

## 2013-06-07 NOTE — Progress Notes (Signed)
Subjective:    Patient ID: Aaron Human., male    DOB: Feb 09, 1966, 47 y.o.   MRN: [id number]  HPI 47 year old male with history below presents with 7 day history of sore throat, fever, and chills. Uncertain of T max. Hurts to swallow but he is able to swallow. Some change to his voice character. Decreased appetite secondary to his sore throat, but otherwise has a normal appetite. No congestion, rhinorrhea, post nasal drip, sinus pressure, cough, headache, or otalgia. No nausea, vomiting, or diarrhea. Has tried OTC cold preps and Tylenol/Motrin without much success. His daughter has been sick with a similar sore throat, although hers has not been quite as bad as his.     PMH: Past Medical History  Diagnosis Date  . HLD (hyperlipidemia)   . Headache(784.0)   . Chest pain     cath 12/31/10: post AV CFX < 10% (no significant CAD), EF 60%   . Family history of ischemic heart disease   . Lipoma     Dr. Gaynelle Adu, consult 2013, pending surgery  . Palpitations     recheck with cardiology 02/2012.  Marland Kitchen Hx of echocardiogram Aug 2012    Normal  . GERD (gastroesophageal reflux disease)     intermittent  . Peyronie disease     Urology consult prior   Allergies  Allergen Reactions  . Iodinated Diagnostic Agents    History   Social History  . Marital Status: Married    Spouse Name: N/A    Number of Children: N/A  . Years of Education: N/A   Occupational History  . 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 Smoking status: Former Smoker -- 0.00 packs/day for .5 years    Quit date: 06/08/2006  . Smokeless tobacco: Not on file  . Alcohol Use: 0.6 oz/week    1 Shots of liquor per week     Comment: occasionally  . Drug Use: No  . Sexual Activity: Yes   Other Topics Concern  . Not on file   Social History Narrative   Basketball, coaches basketball, married, 2 children both teenagers.  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   Exercise - officiate basketball,running up and down the court.  [employer].   Family History  Problem Relation Age of Onset  . Asthma Mother   . Mental illness Mother   . Heart disease Mother     died of CHF  . Arthritis Father   . Diabetes Father   . Heart disease Father     heart transplant, died of heart disease 5 years later  . Arthritis Paternal Aunt   . Diabetes Paternal Aunt   . Arthritis Paternal Uncle   . Diabetes Paternal Uncle   . Hypertension Brother   . Clotting disorder Maternal Aunt   . Cancer Maternal Grandmother   . Stroke Neg Hx      Review of Systems  Constitutional: Positive for fever, chills and fatigue. Negative for appetite change.  HENT: Positive for sore throat and voice change. Negative for congestion, ear pain, postnasal drip, rhinorrhea and sinus pressure.   Gastrointestinal: Negative for nausea, vomiting and diarrhea.  Neurological: Negative for headaches.       Objective:   Physical Exam  Physical Exam: Blood pressure 130/84, pulse 85, temperature 99.1 F (37.3 C), temperature source Oral, resp. rate 16, height 5\' 9"  (1.753 m), weight 234 lb (106.142 kg), SpO2 96.00%.,  Body mass index is 34.54 kg/(m^2). General: Well developed, well nourished, in no acute distress. Head: Normocephalic, atraumatic, eyes without discharge, sclera non-icteric, nares are congested. Bilateral auditory canals clear, TM's are without perforation, pearly grey with reflective cone of light bilaterally. No sinus TTP. Oral cavity moist, dentition normal. Posterior pharynx erythematous with 2+ tonsils bilaterally. No post nasal drip, exudate, or peritonsillar abscess. Uvula midline.  Neck: Supple. No thyromegaly. Full ROM. Lymph nodes: less than 2 cm AC bilaterally. Lungs: Clear to auscultation bilaterally without wheezes, rales, or rhonchi. Breathing is unlabored.  Heart: RRR with S1 S2. No murmurs, rubs, or gallops appreciated. Msk:  Strength and tone normal for  age. Extremities: No clubbing or cyanosis. No edema. Neuro: Alert and oriented X 3. Moves all extremities spontaneously. CNII-XII grossly in tact. Psych:  Responds to questions appropriately with a normal affect.    Labs: Results for orders placed in visit on 06/07/13  POCT CBC      Result Value Range   WBC 14.2 (*) 4.6 - 10.2 K/uL   Lymph, poc 1.7  0.6 - 3.4   POC LYMPH PERCENT 11.9  10 - 50 %L   MID (cbc) 1.0 (*) 0 - 0.9   POC MID % 7.1  0 - 12 %M   POC Granulocyte 11.5 (*) 2 - 6.9   Granulocyte percent 81.0 (*) 37 - 80 %G   RBC 5.06  4.69 - 6.13 M/uL   Hemoglobin 15.3  14.1 - 18.1 g/dL   HCT, POC 16.1  09.6 - 53.7 %   MCV 94.5  80 - 97 fL   MCH, POC 30.2  27 - 31.2 pg   MCHC 32.0  31.8 - 35.4 g/dL   RDW, POC 04.5     Platelet Count, POC 316  142 - 424 K/uL   MPV 7.3  0 - 99.8 fL  POCT RAPID STREP A (OFFICE)      Result Value Range   Rapid Strep A Screen Negative  Negative        Assessment & Plan:  47 year old male with likely strep pharyngitis  -Rocephin 1 gram IM -Augmentin 875/125 mg 1 po bid #20 no RF -Norco 5/325 mg 1 po q 4-6 hours prn pain #30 no RF, SED -Await throat culture -New tooth brush -Tylenol/Motrin -Push fluids -Rest -RTC precautions   Eula Listen, PA-C [facility] [address] [place], [place] [address] [phone] 06/07/2013 10:14 AM

## 2013-06-08 LAB — CULTURE, GROUP A STREP

## 2013-06-09 ENCOUNTER — Encounter: Payer: Self-pay | Admitting: Podiatry

## 2013-06-13 ENCOUNTER — Other Ambulatory Visit: Payer: BC Managed Care – PPO

## 2013-06-15 ENCOUNTER — Ambulatory Visit (INDEPENDENT_AMBULATORY_CARE_PROVIDER_SITE_OTHER): Payer: BC Managed Care – PPO | Admitting: Podiatry

## 2013-06-15 ENCOUNTER — Encounter: Payer: Self-pay | Admitting: Podiatry

## 2013-06-15 VITALS — BP 126/83 | HR 80 | Resp 16

## 2013-06-15 DIAGNOSIS — M722 Plantar fascial fibromatosis: Secondary | ICD-10-CM

## 2013-06-15 NOTE — Progress Notes (Signed)
Dr Milinda Pointer has been seeing me for plantar fasciitis and  He gives me shots it works for a little while but i think its time to get the orthotics ,   Objective: Vital signs are stable he is alert and oriented x3. Pain on palpation medial continued tubercles bilateral.  Assessment: Plantar fasciitis bilateral.  Plan: Get into a pair of new orthotics today.

## 2013-06-26 ENCOUNTER — Other Ambulatory Visit: Payer: BC Managed Care – PPO

## 2013-07-06 ENCOUNTER — Encounter: Payer: Self-pay | Admitting: Podiatry

## 2013-07-06 NOTE — Progress Notes (Signed)
Sent pt a postcard to let him know orthotics are in.

## 2013-07-13 ENCOUNTER — Ambulatory Visit: Payer: BC Managed Care – PPO | Admitting: Podiatry

## 2013-07-31 ENCOUNTER — Encounter: Payer: Self-pay | Admitting: Podiatry

## 2013-07-31 ENCOUNTER — Ambulatory Visit (INDEPENDENT_AMBULATORY_CARE_PROVIDER_SITE_OTHER): Payer: BC Managed Care – PPO | Admitting: Podiatry

## 2013-07-31 VITALS — BP 122/80 | HR 78 | Resp 16

## 2013-07-31 DIAGNOSIS — M722 Plantar fascial fibromatosis: Secondary | ICD-10-CM

## 2013-07-31 MED ORDER — METHYLPREDNISOLONE (PAK) 4 MG PO TABS: ORAL_TABLET | ORAL | Status: DC

## 2013-07-31 NOTE — Patient Instructions (Signed)

## 2013-07-31 NOTE — Progress Notes (Signed)
Pick up orthotics and would like to see dr Milinda Pointer, still having issues with this foot as she refers to his right foot.  Objective: Vital signs are stable he is alert and oriented x3. Pulses are palpable right lower extremity. He still has severe pain on palpation medial calcaneal tubercle of the right heel.  Assessment: Pain in limb secondary to plantar fasciitis right foot.  Plan: He was dispensed is orthotics today he was prescribed a Medrol Dosepak and he was reinjected with Kenalog and local anesthetic to the point of maximal tenderness of the right foot. I will followup with him in one month after his when his orthotics.

## 2013-08-28 ENCOUNTER — Ambulatory Visit: Payer: BC Managed Care – PPO | Admitting: Podiatry

## 2013-10-07 ENCOUNTER — Other Ambulatory Visit: Payer: Self-pay | Admitting: Cardiology

## 2014-02-22 ENCOUNTER — Encounter: Payer: Self-pay | Admitting: Family Medicine

## 2014-02-22 ENCOUNTER — Ambulatory Visit (INDEPENDENT_AMBULATORY_CARE_PROVIDER_SITE_OTHER): Payer: BC Managed Care – PPO | Admitting: Family Medicine

## 2014-02-22 VITALS — BP 118/84 | HR 68 | Ht 71.0 in | Wt 234.0 lb

## 2014-02-22 DIAGNOSIS — N486 Induration penis plastica: Secondary | ICD-10-CM

## 2014-02-22 DIAGNOSIS — Z0000 Encounter for general adult medical examination without abnormal findings: Secondary | ICD-10-CM

## 2014-02-22 DIAGNOSIS — R002 Palpitations: Secondary | ICD-10-CM

## 2014-02-22 DIAGNOSIS — Z23 Encounter for immunization: Secondary | ICD-10-CM

## 2014-02-22 DIAGNOSIS — E785 Hyperlipidemia, unspecified: Secondary | ICD-10-CM

## 2014-02-22 DIAGNOSIS — R072 Precordial pain: Secondary | ICD-10-CM

## 2014-02-22 LAB — CBC WITH DIFFERENTIAL/PLATELET
BASOS ABS: 0 10*3/uL (ref 0.0–0.1)
BASOS PCT: 0 % (ref 0–1)
EOS ABS: 0.2 10*3/uL (ref 0.0–0.7)
EOS PCT: 3 % (ref 0–5)
HCT: 42.4 % (ref 39.0–52.0)
HEMOGLOBIN: 15.1 g/dL (ref 13.0–17.0)
Lymphocytes Relative: 29 % (ref 12–46)
Lymphs Abs: 2 10*3/uL (ref 0.7–4.0)
MCH: 30.2 pg (ref 26.0–34.0)
MCHC: 35.6 g/dL (ref 30.0–36.0)
MCV: 84.8 fL (ref 78.0–100.0)
MONO ABS: 0.8 10*3/uL (ref 0.1–1.0)
MONOS PCT: 12 % (ref 3–12)
Neutro Abs: 3.9 10*3/uL (ref 1.7–7.7)
Neutrophils Relative %: 56 % (ref 43–77)
Platelets: 324 10*3/uL (ref 150–400)
RBC: 5 MIL/uL (ref 4.22–5.81)
RDW: 14.1 % (ref 11.5–15.5)
WBC: 7 10*3/uL (ref 4.0–10.5)

## 2014-02-22 LAB — HEMOCCULT GUIAC POC 1CARD (OFFICE)

## 2014-02-22 LAB — COMPREHENSIVE METABOLIC PANEL
ALK PHOS: 92 U/L (ref 39–117)
ALT: 38 U/L (ref 0–53)
AST: 46 U/L — AB (ref 0–37)
Albumin: 4.3 g/dL (ref 3.5–5.2)
BUN: 15 mg/dL (ref 6–23)
CO2: 27 mEq/L (ref 19–32)
CREATININE: 1.1 mg/dL (ref 0.50–1.35)
Calcium: 9 mg/dL (ref 8.4–10.5)
Chloride: 102 mEq/L (ref 96–112)
GLUCOSE: 81 mg/dL (ref 70–99)
Potassium: 4 mEq/L (ref 3.5–5.3)
Sodium: 137 mEq/L (ref 135–145)
Total Bilirubin: 0.6 mg/dL (ref 0.2–1.2)
Total Protein: 7.1 g/dL (ref 6.0–8.3)

## 2014-02-22 LAB — LIPID PANEL
CHOL/HDL RATIO: 4 ratio
Cholesterol: 166 mg/dL (ref 0–200)
HDL: 42 mg/dL (ref >39)
LDL CALC: 100 mg/dL — AB (ref 0–99)
Triglycerides: 122 mg/dL (ref <150)
VLDL: 24 mg/dL (ref 0–40)

## 2014-02-22 NOTE — Progress Notes (Signed)
   Subjective:    Patient ID: Aaron Grim., male    DOB: 1966/03/11, 48 y.o.   MRN: [id number]  HPI He is here for complete examination. His weight did go up since his last visit he has made diet and exercise changes to help reverse this. He is trying to get back down to 38 waist. He continues on Mevacor for his hyperlipidemia. He does have Peyroni's disease however it is not interfering with his function. He does not have OSA. The record was reviewed and shows no evidence of this. He is no longer having any difficulty with palpitations. He does occasionally have some chest discomfort but cannot relate this to any particular. His work as a Pharmacist, hospital continues to go well. His home life is good. Family and social history are unchanged.   Review of Systems  All other systems reviewed and are negative.      Objective:   Physical Exam BP 118/84  Pulse 68  Ht 5\' 11"  (1.803 m)  Wt 234 lb (106.142 kg)  BMI 32.65 kg/m2  SpO2 98%  General Appearance:    Alert, cooperative, no distress, appears stated age  Head:    Normocephalic, without obvious abnormality, atraumatic  Eyes:    PERRL, conjunctiva/corneas clear, EOM's intact, fundi    benign  Ears:    Normal TM's and external ear canals  Nose:   Nares normal, mucosa normal, no drainage or sinus   tenderness  Throat:   Lips, mucosa, and tongue normal; teeth and gums normal  Neck:   Supple, no lymphadenopathy;  thyroid:  no   enlargement/tenderness/nodules; no carotid   bruit or JVD  Back:    Spine nontender, no curvature, ROM normal, no CVA     tenderness  Lungs:     Clear to auscultation bilaterally without wheezes, rales or     ronchi; respirations unlabored  Chest Wall:    No tenderness or deformity   Heart:    Regular rate and rhythm, S1 and S2 normal, no murmur, rub   or gallop  Breast Exam:    No chest wall tenderness, masses or gynecomastia  Abdomen:     Soft, non-tender, nondistended, normoactive bowel sounds,    no masses, no  hepatosplenomegaly  Genitalia:  deferred  Rectal:    Normal sphincter tone, no masses or tenderness; guaiac negative stool.  Prostate smooth, no nodules, not enlarged.  Extremities:   No clubbing, cyanosis or edema  Pulses:   2+ and symmetric all extremities  Skin:   Skin color, texture, turgor normal, no rashes or lesions  Lymph nodes:   Cervical, supraclavicular, and axillary nodes normal  Neurologic:   CNII-XII intact, normal strength, sensation and gait; reflexes 2+ and symmetric throughout          Psych:   Normal mood, affect, hygiene and grooming.          Assessment & Plan:  Routine general medical examination at a health care facility - Plan: CBC with Differential, Comprehensive metabolic panel, Lipid panel, POCT occult blood stool  Palpitations  Hyperlipidemia - Plan: Lipid panel  Precordial pain  Need for prophylactic vaccination and inoculation against influenza - Plan: Flu Vaccine QUAD 36+ mos IM  flu shot offered and he refused. Encouraged him to continue with weight loss and dietary changes. Continue on present medication regimen.

## 2014-03-29 ENCOUNTER — Ambulatory Visit (INDEPENDENT_AMBULATORY_CARE_PROVIDER_SITE_OTHER): Payer: BC Managed Care – PPO | Admitting: Podiatry

## 2014-03-29 DIAGNOSIS — M722 Plantar fascial fibromatosis: Secondary | ICD-10-CM

## 2014-04-01 NOTE — Patient Instructions (Signed)
Plantar Fasciitis (Heel Spur Syndrome) with Rehab The plantar fascia is a fibrous, ligament-like, soft-tissue structure that spans the bottom of the foot. Plantar fasciitis is a condition that causes pain in the foot due to inflammation of the tissue. SYMPTOMS   Pain and tenderness on the underneath side of the foot.  Pain that worsens with standing or walking. CAUSES  Plantar fasciitis is caused by irritation and injury to the plantar fascia on the underneath side of the foot. Common mechanisms of injury include:  Direct trauma to bottom of the foot.  Damage to a small nerve that runs under the foot where the main fascia attaches to the heel bone.  Stress placed on the plantar fascia due to bone spurs. RISK INCREASES WITH:   Activities that place stress on the plantar fascia (running, jumping, pivoting, or cutting).  Poor strength and flexibility.  Improperly fitted shoes.  Tight calf muscles.  Flat feet.  Failure to warm-up properly before activity.  Obesity. PREVENTION  Warm up and stretch properly before activity.  Allow for adequate recovery between workouts.  Maintain physical fitness:  Strength, flexibility, and endurance.  Cardiovascular fitness.  Maintain a health body weight.  Avoid stress on the plantar fascia.  Wear properly fitted shoes, including arch supports for individuals who have flat feet. PROGNOSIS  If treated properly, then the symptoms of plantar fasciitis usually resolve without surgery. However, occasionally surgery is necessary. RELATED COMPLICATIONS   Recurrent symptoms that may result in a chronic condition.  Problems of the lower back that are caused by compensating for the injury, such as limping.  Pain or weakness of the foot during push-off following surgery.  Chronic inflammation, scarring, and partial or complete fascia tear, occurring more often from repeated injections. TREATMENT  Treatment initially involves the use of  ice and medication to help reduce pain and inflammation. The use of strengthening and stretching exercises may help reduce pain with activity, especially stretches of the Achilles tendon. These exercises may be performed at home or with a therapist. Your caregiver may recommend that you use heel cups of arch supports to help reduce stress on the plantar fascia. Occasionally, corticosteroid injections are given to reduce inflammation. If symptoms persist for greater than 6 months despite non-surgical (conservative), then surgery may be recommended.  MEDICATION   If pain medication is necessary, then nonsteroidal anti-inflammatory medications, such as aspirin and ibuprofen, or other minor pain relievers, such as acetaminophen, are often recommended.  Do not take pain medication within 7 days before surgery.  Prescription pain relievers may be given if deemed necessary by your caregiver. Use only as directed and only as much as you need.  Corticosteroid injections may be given by your caregiver. These injections should be reserved for the most serious cases, because they may only be given a certain number of times. HEAT AND COLD  Cold treatment (icing) relieves pain and reduces inflammation. Cold treatment should be applied for 10 to 15 minutes every 2 to 3 hours for inflammation and pain and immediately after any activity that aggravates your symptoms. Use ice packs or massage the area with a piece of ice (ice massage).  Heat treatment may be used prior to performing the stretching and strengthening activities prescribed by your caregiver, physical therapist, or athletic trainer. Use a heat pack or soak the injury in warm water. SEEK IMMEDIATE MEDICAL CARE IF:  Treatment seems to offer no benefit, or the condition worsens.  Any medications produce adverse side effects. EXERCISES RANGE   OF MOTION (ROM) AND STRETCHING EXERCISES - Plantar Fasciitis (Heel Spur Syndrome) These exercises may help you  when beginning to rehabilitate your injury. Your symptoms may resolve with or without further involvement from your physician, physical therapist or athletic trainer. While completing these exercises, remember:   Restoring tissue flexibility helps normal motion to return to the joints. This allows healthier, less painful movement and activity.  An effective stretch should be held for at least 30 seconds.  A stretch should never be painful. You should only feel a gentle lengthening or release in the stretched tissue. RANGE OF MOTION - Toe Extension, Flexion  Sit with your right / left leg crossed over your opposite knee.  Grasp your toes and gently pull them back toward the top of your foot. You should feel a stretch on the bottom of your toes and/or foot.  Hold this stretch for __________ seconds.  Now, gently pull your toes toward the bottom of your foot. You should feel a stretch on the top of your toes and or foot.  Hold this stretch for __________ seconds. Repeat __________ times. Complete this stretch __________ times per day.  RANGE OF MOTION - Ankle Dorsiflexion, Active Assisted  Remove shoes and sit on a chair that is preferably not on a carpeted surface.  Place right / left foot under knee. Extend your opposite leg for support.  Keeping your heel down, slide your right / left foot back toward the chair until you feel a stretch at your ankle or calf. If you do not feel a stretch, slide your bottom forward to the edge of the chair, while still keeping your heel down.  Hold this stretch for __________ seconds. Repeat __________ times. Complete this stretch __________ times per day.  STRETCH - Gastroc, Standing  Place hands on wall.  Extend right / left leg, keeping the front knee somewhat bent.  Slightly point your toes inward on your back foot.  Keeping your right / left heel on the floor and your knee straight, shift your weight toward the wall, not allowing your back to  arch.  You should feel a gentle stretch in the right / left calf. Hold this position for __________ seconds. Repeat __________ times. Complete this stretch __________ times per day. STRETCH - Soleus, Standing  Place hands on wall.  Extend right / left leg, keeping the other knee somewhat bent.  Slightly point your toes inward on your back foot.  Keep your right / left heel on the floor, bend your back knee, and slightly shift your weight over the back leg so that you feel a gentle stretch deep in your back calf.  Hold this position for __________ seconds. Repeat __________ times. Complete this stretch __________ times per day. STRETCH - Gastrocsoleus, Standing  Note: This exercise can place a lot of stress on your foot and ankle. Please complete this exercise only if specifically instructed by your caregiver.   Place the ball of your right / left foot on a step, keeping your other foot firmly on the same step.  Hold on to the wall or a rail for balance.  Slowly lift your other foot, allowing your body weight to press your heel down over the edge of the step.  You should feel a stretch in your right / left calf.  Hold this position for __________ seconds.  Repeat this exercise with a slight bend in your right / left knee. Repeat __________ times. Complete this stretch __________ times per day.    STRENGTHENING EXERCISES - Plantar Fasciitis (Heel Spur Syndrome)  These exercises may help you when beginning to rehabilitate your injury. They may resolve your symptoms with or without further involvement from your physician, physical therapist or athletic trainer. While completing these exercises, remember:   Muscles can gain both the endurance and the strength needed for everyday activities through controlled exercises.  Complete these exercises as instructed by your physician, physical therapist or athletic trainer. Progress the resistance and repetitions only as guided. STRENGTH -  Towel Curls  Sit in a chair positioned on a non-carpeted surface.  Place your foot on a towel, keeping your heel on the floor.  Pull the towel toward your heel by only curling your toes. Keep your heel on the floor.  If instructed by your physician, physical therapist or athletic trainer, add ____________________ at the end of the towel. Repeat __________ times. Complete this exercise __________ times per day. STRENGTH - Ankle Inversion  Secure one end of a rubber exercise band/tubing to a fixed object (table, pole). Loop the other end around your foot just before your toes.  Place your fists between your knees. This will focus your strengthening at your ankle.  Slowly, pull your big toe up and in, making sure the band/tubing is positioned to resist the entire motion.  Hold this position for __________ seconds.  Have your muscles resist the band/tubing as it slowly pulls your foot back to the starting position. Repeat __________ times. Complete this exercises __________ times per day.  Document Released: 05/25/2005 Document Revised: 08/17/2011 Document Reviewed: 09/06/2008 [employer] Patient Information 2015 [employer]. This information is not intended to replace advice given to you by your health care provider. Make sure you discuss any questions you have with your health care provider.  

## 2014-04-01 NOTE — Progress Notes (Signed)
Patient ID: Aaron Sinning., Aaron Patel   DOB: February 02, 1966, 48 y.o.   MRN: [id number]  Subjective: Patient returns for follow-up evaluation and reoccurrence of pain to the right heel. Patient states that he previously has been treated for plantar fasciitis and he has had an injection to the heel earlier this year. After the injection patient had relief of symptoms. Also states that he has had inserts made however that he is requesting another pair at this time that are more rigid. He's been continuing with icing. No acute changes since last appointment. No acute injury or trauma to the area. No other complaints at this time.  Objective: AAO x3, NAD DP/PT pulses palpable bilaterally, CRT less than 3 seconds Protective sensation intact with Simms Weinstein monofilament, vibratory sensation intact, Achilles tendon reflex intact Tenderness palpation of the plantar aspect of the right heel at the insertion of the plantar fascia. There is no pain along the course of plantar fascia with the arch of the foot. No pain with lateral compression of the calcaneus or pain vibratory sensation. No pain on the posterior aspect of the calcaneus or along the course of the Achilles tendon. MMT 5/5, ROM WNL No calf pain, swelling, warmth, erythema.  Assessment: 48 year old Aaron Patel with right foot plantar fasciitis.  Plan: -Conservative versus surgical treatment discussed including alternatives, risks, complications. -Patient elects to proceed with steroid injection into the right heel. Under sterile skin preparation, a total of 2.5cc of kenalog 10, 0.5% Marcaine plain, and 2% lidocaine plain were infiltrated into the symptomatic area without complication. A band-aid was applied. Patient tolerated the injection well without complication.  -Discussed stretching exercises. -Ice to the affected area. -At today's appointment the computer system was down and we were unable to take scans for new orthotics. We'll scan him at next  appointment for new inserts or he can call the office to have this done at his conviencence. Follow-up in 4 weeks or sooner if any problems are to arise or any change in symptoms. In the meantime call the office any questions, concerns.

## 2014-04-09 ENCOUNTER — Other Ambulatory Visit: Payer: Self-pay | Admitting: Cardiology

## 2014-04-26 ENCOUNTER — Ambulatory Visit: Payer: BC Managed Care – PPO | Admitting: Podiatry

## 2014-05-23 ENCOUNTER — Telehealth: Payer: Self-pay | Admitting: Family Medicine

## 2014-05-23 ENCOUNTER — Other Ambulatory Visit: Payer: Self-pay | Admitting: Cardiology

## 2014-05-23 NOTE — Telephone Encounter (Signed)
Needs lovastatin  [facility]

## 2014-05-24 MED ORDER — LOVASTATIN 40 MG PO TABS: 40.0000 mg | ORAL_TABLET | Freq: Every day | ORAL | Status: DC

## 2014-07-02 ENCOUNTER — Encounter: Payer: Self-pay | Admitting: Medical

## 2014-07-02 ENCOUNTER — Ambulatory Visit (INDEPENDENT_AMBULATORY_CARE_PROVIDER_SITE_OTHER): Payer: BC Managed Care – PPO | Admitting: Medical

## 2014-07-02 VITALS — BP 110/80 | HR 74 | Temp 98.7°F | Resp 16 | Wt 228.0 lb

## 2014-07-02 DIAGNOSIS — N5082 Scrotal pain: Secondary | ICD-10-CM

## 2014-07-02 DIAGNOSIS — Z113 Encounter for screening for infections with a predominantly sexual mode of transmission: Secondary | ICD-10-CM

## 2014-07-02 DIAGNOSIS — N508 Other specified disorders of male genital organs: Secondary | ICD-10-CM

## 2014-07-02 DIAGNOSIS — M545 Low back pain, unspecified: Secondary | ICD-10-CM

## 2014-07-02 DIAGNOSIS — N410 Acute prostatitis: Secondary | ICD-10-CM

## 2014-07-02 DIAGNOSIS — R102 Pelvic and perineal pain: Secondary | ICD-10-CM

## 2014-07-02 LAB — POCT URINALYSIS DIPSTICK
BILIRUBIN UA: NEGATIVE
Blood, UA: NEGATIVE
GLUCOSE UA: NEGATIVE
Ketones, UA: NEGATIVE
LEUKOCYTES UA: NEGATIVE
Nitrite, UA: NEGATIVE
PROTEIN UA: NEGATIVE
Spec Grav, UA: >=1.03
Urobilinogen, UA: NEGATIVE
pH, UA: 6

## 2014-07-02 LAB — HIV ANTIBODY (ROUTINE TESTING W REFLEX): HIV 1&2 Ab, 4th Generation: NONREACTIVE

## 2014-07-02 LAB — RPR

## 2014-07-02 MED ORDER — CIPROFLOXACIN HCL 500 MG PO TABS: 500.0000 mg | ORAL_TABLET | Freq: Two times a day (BID) | ORAL | Status: DC

## 2014-07-02 NOTE — Patient Instructions (Signed)

## 2014-07-02 NOTE — Progress Notes (Signed)
Subjective: Here for discomfort in lower abdomen, scrotum and low back.  Started 3 weeks ago with temporarily discomfort in scrotum.  Gradually he started getting discomfort in low back and lower abdomen.   Last night felt like both testicles was somewhat swollen.  No penile discharge, no blood in semen or urine.  No hx/o urinary or prostate infection.   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   Married, no current concern for STD, but wants me to go ahead and screen for STD.   In general, hasn't been having back troubles but has had occasional back problems.  No fever, no nausea, no rectal discomfort. No other aggravating or relieving factors. No other complaint.  Past Medical History  Diagnosis Date  . HLD (hyperlipidemia)   . Headache(784.0)   . Chest pain     cath 12/31/10: post AV CFX < 10% (no significant CAD), EF 60%   . Family history of ischemic heart disease   . Lipoma     Dr. Greer Pickerel, consult 2013, pending surgery  . Palpitations     recheck with cardiology 02/2012.  Marland Kitchen Hx of echocardiogram Aug 2012    Normal  . GERD (gastroesophageal reflux disease)     intermittent  . Peyronie disease     Urology consult prior   ROS as in subjective  Objective: BP 110/80 mmHg  Pulse 74  Temp(Src) 98.7 F (37.1 C) (Oral)  Resp 16  Wt 228 lb (103.42 kg)  Gen: wd, wn,nad Skin:unremarkable Abdomen: +bs, soft, nontender, no mass, no organomegaly Back: nontender, mild pain in low back with flexion,but ROM full, there is a large fatty lump >10cm diameter in low back suggestive of lipoma, unchanged per years per patient MSK: legs nontender, normal ROM GU: circumcised, nontender, no mass, no swelling, no discharge, no hernia DRE: prostate seems boggy, otherwise no nodule, no deformity   Assessment:  Encounter Diagnoses  Name Primary?  . Prostatitis, acute Yes  . Bilateral low back pain without sciatica   . Scrotum pain   . Pelvic pain in male   . Screen for STD (sexually  transmitted disease)    Plan: discussed possible causes, differential, but most likely prostatitis.  discussed diagnosis, treatment, begin Cipro.  Urinalysis unremarkable.  Reassured that the low back lipoma is not causing these problems.  Labs today, f/u pending labs.

## 2014-07-03 LAB — GC/CHLAMYDIA PROBE AMP
CT Probe RNA: NEGATIVE
GC Probe RNA: NEGATIVE

## 2014-10-30 ENCOUNTER — Telehealth: Payer: Self-pay | Admitting: Internal Medicine

## 2014-10-30 NOTE — Telephone Encounter (Signed)
Pt called [facility] to get back in with Dr. Greer Pickerel for a lump on back and because its been like 3 years, he needs a referral again back to there.

## 2014-10-31 NOTE — Telephone Encounter (Signed)
I left the patient a detail message to call the office to schedule an appointment for surgery clearence

## 2014-10-31 NOTE — Telephone Encounter (Signed)
Have him come in for referral visit and surgical clearance

## 2014-11-06 ENCOUNTER — Institutional Professional Consult (permissible substitution): Payer: Self-pay | Admitting: Medical

## 2014-11-09 ENCOUNTER — Encounter: Payer: Self-pay | Admitting: Medical

## 2014-11-09 ENCOUNTER — Ambulatory Visit (INDEPENDENT_AMBULATORY_CARE_PROVIDER_SITE_OTHER): Payer: BC Managed Care – PPO | Admitting: Medical

## 2014-11-09 VITALS — BP 114/70 | HR 80 | Temp 98.1°F | Resp 15 | Wt 230.0 lb

## 2014-11-09 DIAGNOSIS — D171 Benign lipomatous neoplasm of skin and subcutaneous tissue of trunk: Secondary | ICD-10-CM | POA: Diagnosis not present

## 2014-11-09 DIAGNOSIS — E785 Hyperlipidemia, unspecified: Secondary | ICD-10-CM | POA: Diagnosis not present

## 2014-11-09 DIAGNOSIS — Z01818 Encounter for other preprocedural examination: Secondary | ICD-10-CM

## 2014-11-09 DIAGNOSIS — E669 Obesity, unspecified: Secondary | ICD-10-CM | POA: Diagnosis not present

## 2014-11-09 LAB — BASIC METABOLIC PANEL
BUN: 18 mg/dL (ref 6–23)
CHLORIDE: 102 meq/L (ref 96–112)
CO2: 28 meq/L (ref 19–32)
Calcium: 8.9 mg/dL (ref 8.4–10.5)
Creat: 0.91 mg/dL (ref 0.50–1.35)
GLUCOSE: 85 mg/dL (ref 70–99)
Potassium: 4.5 mEq/L (ref 3.5–5.3)
SODIUM: 139 meq/L (ref 135–145)

## 2014-11-09 LAB — CBC
HEMATOCRIT: 44.6 % (ref 39.0–52.0)
HEMOGLOBIN: 15.4 g/dL (ref 13.0–17.0)
MCH: 30 pg (ref 26.0–34.0)
MCHC: 34.5 g/dL (ref 30.0–36.0)
MCV: 86.9 fL (ref 78.0–100.0)
MPV: 9.2 fL (ref 8.6–12.4)
PLATELETS: 318 10*3/uL (ref 150–400)
RBC: 5.13 MIL/uL (ref 4.22–5.81)
RDW: 14 % (ref 11.5–15.5)
WBC: 6 10*3/uL (ref 4.0–10.5)

## 2014-11-09 NOTE — Progress Notes (Signed)
Subjective: Here for preop clearance.  Has lipoma, saw surgery for this few years ago, but they want him to have preop clearance before they will re-consult on this.   He had to reschedule from 3 years ago due to financial issues.     Been having some back pains for 10 years, recently the lipoma of back seems to be pressing on things causing pain.  No back pain with exercise.   No leg pain, no paresthesias.   Back pain worse with sitting for a while, always on the left.     No recent other concerns.  Exercises regularly, officiates basketball, so active on the job.     Medical team: Wyatt Haste, MD and Dorothea Ogle, PA-C here for primary care Sees cardiology every few years given family history, had cath 2012, stress test 2014 Sees eye doctor, dentist  Review of Systems Constitutional: -fever, -chills, -sweats, -unexpected weight change,-fatigue ENT: -runny nose, -ear pain, -sore throat Cardiology:  -chest pain, -palpitations, -edema Respiratory: -cough, -shortness of breath, -wheezing Gastroenterology: -abdominal pain, -nausea, -vomiting, -diarrhea, -constipation  Hematology: -bleeding or bruising problems Musculoskeletal: -arthralgias, -myalgias, -joint swelling, +back pain Ophthalmology: -vision changes Urology: -dysuria, -difficulty urinating, -hematuria, -urinary frequency, -urgency Neurology: -headache, -weakness, -tingling, -numbness  Past Medical History  Diagnosis Date  . HLD (hyperlipidemia)   . Headache(784.0)   . Chest pain     cath 12/31/10: post AV CFX < 10% (no significant CAD), EF 60%   . Family history of ischemic heart disease   . Lipoma     Dr. Greer Pickerel, consult 2013, pending surgery  . Palpitations     recheck with cardiology 02/2012.  Marland Kitchen Hx of echocardiogram Aug 2012    Normal  . GERD (gastroesophageal reflux disease)     intermittent  . Peyronie disease     Urology consult prior  . Obesity     Past Surgical History  Procedure Laterality  Date  . Cardiac catheterization  12/2010    Dr. Marigene Ehlers   . Stye surgery      History   Social History  . Marital Status: Married    Spouse Name: N/A  . Number of Children: N/A  . Years of Education: N/A   Occupational History  . 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 Smoking status: Former Smoker -- 0.00 packs/day for .5 years    Quit date: 06/08/2006  . Smokeless tobacco: Not on file  . Alcohol Use: 0.6 oz/week    1 Shots of liquor per week     Comment: occasionally  . Drug Use: No  . Sexual Activity: Yes   Other Topics Concern  . Not on file   Social History Narrative   Basketball, coaches basketball, married, 2 children both teenagers.  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  Exercise - officiate basketball,running up and down the court.  [employer].    Family History  Problem Relation Age of Onset  . Asthma Mother   . Mental illness Mother   . Heart disease Mother     died of CHF  . Arthritis Father   . Diabetes Father   . Heart disease Father     heart transplant, died of heart disease 5 years later  . Arthritis Paternal Aunt   . Diabetes Paternal Aunt   . Arthritis Paternal Uncle   . Diabetes Paternal Uncle   . Hypertension Brother   . Clotting disorder Maternal  Aunt   . Cancer Maternal Grandmother   . Stroke Neg Hx      Current outpatient prescriptions:  .  lovastatin (MEVACOR) 40 MG tablet, Take 1 tablet (40 mg total) by mouth at bedtime., Disp: 90 tablet, Rfl: 3  Allergies  Allergen Reactions  . Iodinated Diagnostic Agents     Objective: BP 114/70 mmHg  Pulse 80  Temp(Src) 98.1 F (36.7 C) (Oral)  Resp 15  Wt 230 lb (104.327 kg)  General appearance: alert, no distress, WD/WN, african [place] male  Skin: left shoulder with slightly raised round brown 23mm lesion unchanged for years per patient, few small 2-3 mm papular lesions of the face, benign-appearing, unchanged for years per patient,  other scattered benign appearing macules . Large lipoma of left low back. HEENT: normocephalic, conjunctiva/corneas normal, sclerae anicteric, PERRLA, EOMi, nares patent, no discharge or erythema, pharynx normal  Oral cavity: MMM, tongue normal, teeth in good repair  Neck: supple, no lymphadenopathy, no thyromegaly, no masses, normal ROM, no bruits  Chest: non tender, normal shape and expansion  Heart: RRR, normal S1, S2, no murmurs  Lungs: CTA bilaterally, no wheezes, rhonchi, or rales  Abdomen: +bs, soft, non tender, non distended, no masses, no hepatomegaly, no splenomegaly, no bruits  Back: low back midline to left of midline with large spongy mass suggestive of lipoma, otherwise non tender, normal ROM, no scoliosis  Musculoskeletal: upper extremities non tender, no obvious deformity, normal ROM throughout, lower extremities non tender, no obvious deformity, normal ROM throughout  Extremities: no edema, no cyanosis, no clubbing  Pulses: 2+ symmetric, upper and lower extremities, normal cap refill  Neurological: alert, oriented x 3, CN2-12 intact, strength normal upper extremities and lower extremities, sensation normal throughout, DTRs 2+ throughout, no cerebellar signs, gait normal  Psychiatric: normal affect, behavior normal, pleasant    Adult ECG Report  Indication: preop exam  Rate: 66 bpm  Rhythm: normal sinus rhythm  QRS Axis: 48 degrees  PR Interval: 131ms  QRS Duration: 52ms  QTc: 351ms  Conduction Disturbances: none  Other Abnormalities: none  Patient's cardiac risk factors are: dyslipidemia, male gender and obesity (BMI >= 30 kg/m2).  EKG comparison: 2014   Narrative Interpretation: no change, normal EKG   Assessment: Encounter Diagnoses  Name Primary?  . Preop examination Yes  . HLD (hyperlipidemia)   . Lipoma of back   . Obesity     Plan: Reviewed chart history, prior stress testing including 2014.  Labs today, updated EKG reviewed today.    pending labs, referral on to general surgery for excision of lipoma.

## 2014-11-10 LAB — APTT: aPTT: 36 seconds (ref 24–37)

## 2014-11-10 LAB — PROTIME-INR
INR: 0.99 (ref <1.50)
Prothrombin Time: 13.1 seconds (ref 11.6–15.2)

## 2015-01-04 ENCOUNTER — Telehealth: Payer: Self-pay | Admitting: Internal Medicine

## 2015-01-04 ENCOUNTER — Encounter (HOSPITAL_COMMUNITY): Payer: Self-pay | Admitting: Emergency Medicine

## 2015-01-04 ENCOUNTER — Emergency Department (HOSPITAL_COMMUNITY)
Admission: EM | Admit: 2015-01-04 | Discharge: 2015-01-04 | Disposition: A | Discharge status: Home / Self Care | Payer: BC Managed Care – PPO | Attending: Emergency Medicine | Admitting: Emergency Medicine

## 2015-01-04 DIAGNOSIS — Z9889 Other specified postprocedural states: Secondary | ICD-10-CM | POA: Insufficient documentation

## 2015-01-04 DIAGNOSIS — E785 Hyperlipidemia, unspecified: Secondary | ICD-10-CM | POA: Insufficient documentation

## 2015-01-04 DIAGNOSIS — Z86018 Personal history of other benign neoplasm: Secondary | ICD-10-CM | POA: Diagnosis not present

## 2015-01-04 DIAGNOSIS — Z87438 Personal history of other diseases of male genital organs: Secondary | ICD-10-CM | POA: Insufficient documentation

## 2015-01-04 DIAGNOSIS — M545 Low back pain: Secondary | ICD-10-CM | POA: Diagnosis present

## 2015-01-04 DIAGNOSIS — Z8719 Personal history of other diseases of the digestive system: Secondary | ICD-10-CM | POA: Insufficient documentation

## 2015-01-04 DIAGNOSIS — Z87891 Personal history of nicotine dependence: Secondary | ICD-10-CM | POA: Diagnosis not present

## 2015-01-04 DIAGNOSIS — M5441 Lumbago with sciatica, right side: Secondary | ICD-10-CM | POA: Insufficient documentation

## 2015-01-04 LAB — URINALYSIS, ROUTINE W REFLEX MICROSCOPIC
Bilirubin Urine: NEGATIVE
Glucose, UA: NEGATIVE mg/dL
Hgb urine dipstick: NEGATIVE
Ketones, ur: NEGATIVE mg/dL
Leukocytes, UA: NEGATIVE
NITRITE: NEGATIVE
PROTEIN: NEGATIVE mg/dL
SPECIFIC GRAVITY, URINE: 1.013 (ref 1.005–1.030)
UROBILINOGEN UA: 0.2 mg/dL (ref 0.0–1.0)
pH: 6.5 (ref 5.0–8.0)

## 2015-01-04 MED ORDER — OXYCODONE-ACETAMINOPHEN 5-325 MG PO TABS
1.0000 | ORAL_TABLET | Freq: Once | ORAL | Status: AC
  Administered 2015-01-04: 1 via ORAL

## 2015-01-04 MED ORDER — CYCLOBENZAPRINE HCL 10 MG PO TABS
5.0000 mg | ORAL_TABLET | Freq: Once | ORAL | Status: AC
  Administered 2015-01-04: 5 mg via ORAL
  Filled 2015-01-04: qty 1

## 2015-01-04 MED ORDER — OXYCODONE-ACETAMINOPHEN 5-325 MG PO TABS
ORAL_TABLET | ORAL | Status: AC
  Filled 2015-01-04: qty 1

## 2015-01-04 MED ORDER — CYCLOBENZAPRINE HCL 5 MG PO TABS: 5.0000 mg | ORAL_TABLET | Freq: Three times a day (TID) | ORAL | Status: DC | PRN

## 2015-01-04 MED ORDER — IBUPROFEN 600 MG PO TABS: 600.0000 mg | ORAL_TABLET | Freq: Three times a day (TID) | ORAL | Status: DC

## 2015-01-04 MED ORDER — KETOROLAC TROMETHAMINE 60 MG/2ML IM SOLN
60.0000 mg | Freq: Once | INTRAMUSCULAR | Status: AC
  Administered 2015-01-04: 60 mg via INTRAMUSCULAR
  Filled 2015-01-04: qty 2

## 2015-01-04 NOTE — Discharge Instructions (Signed)
Sciatica Sciatica is pain, weakness, numbness, or tingling along the path of the sciatic nerve. The nerve starts in the lower back and runs down the back of each leg. The nerve controls the muscles in the lower leg and in the back of the knee, while also providing sensation to the back of the thigh, lower leg, and the sole of your foot. Sciatica is a symptom of another medical condition. For instance, nerve damage or certain conditions, such as a herniated disk or bone spur on the spine, pinch or put pressure on the sciatic nerve. This causes the pain, weakness, or other sensations normally associated with sciatica. Generally, sciatica only affects one side of the body. CAUSES   Herniated or slipped disc.  Degenerative disk disease.  A pain disorder involving the narrow muscle in the buttocks (piriformis syndrome).  Pelvic injury or fracture.  Pregnancy.  Tumor (rare). SYMPTOMS  Symptoms can vary from mild to very severe. The symptoms usually travel from the low back to the buttocks and down the back of the leg. Symptoms can include:  Mild tingling or dull aches in the lower back, leg, or hip.  Numbness in the back of the calf or sole of the foot.  Burning sensations in the lower back, leg, or hip.  Sharp pains in the lower back, leg, or hip.  Leg weakness.  Severe back pain inhibiting movement. These symptoms may get worse with coughing, sneezing, laughing, or prolonged sitting or standing. Also, being overweight may worsen symptoms. DIAGNOSIS  Your caregiver will perform a physical exam to look for common symptoms of sciatica. He or she may ask you to do certain movements or activities that would trigger sciatic nerve pain. Other tests may be performed to find the cause of the sciatica. These may include:  Blood tests.  X-rays.  Imaging tests, such as an MRI or CT scan. TREATMENT  Treatment is directed at the cause of the sciatic pain. Sometimes, treatment is not necessary  and the pain and discomfort goes away on its own. If treatment is needed, your caregiver may suggest:  Over-the-counter medicines to relieve pain.  Prescription medicines, such as anti-inflammatory medicine, muscle relaxants, or narcotics.  Applying heat or ice to the painful area.  Steroid injections to lessen pain, irritation, and inflammation around the nerve.  Reducing activity during periods of pain.  Exercising and stretching to strengthen your abdomen and improve flexibility of your spine. Your caregiver may suggest losing weight if the extra weight makes the back pain worse.  Physical therapy.  Surgery to eliminate what is pressing or pinching the nerve, such as a bone spur or part of a herniated disk. HOME CARE INSTRUCTIONS   Only take over-the-counter or prescription medicines for pain or discomfort as directed by your caregiver.  Apply ice to the affected area for 20 minutes, 3-4 times a day for the first 48-72 hours. Then try heat in the same way.  Exercise, stretch, or perform your usual activities if these do not aggravate your pain.  Attend physical therapy sessions as directed by your caregiver.  Keep all follow-up appointments as directed by your caregiver.  Do not wear high heels or shoes that do not provide proper support.  Check your mattress to see if it is too soft. A firm mattress may lessen your pain and discomfort. SEEK IMMEDIATE MEDICAL CARE IF:   You lose control of your bowel or bladder (incontinence).  You have increasing weakness in the lower back, pelvis, buttocks,   or legs.  You have redness or swelling of your back.  You have a burning sensation when you urinate.  You have pain that gets worse when you lie down or awakens you at night.  Your pain is worse than you have experienced in the past.  Your pain is lasting longer than 4 weeks.  You are suddenly losing weight without reason. MAKE SURE YOU:  Understand these  instructions.  Will watch your condition.  Will get help right away if you are not doing well or get worse. Document Released: 05/19/2001 Document Revised: 11/24/2011 Document Reviewed: 10/04/2011 ExitCare Patient Information 2015 [employer]. This information is not intended to replace advice given to you by your health care provider. Make sure you discuss any questions you have with your health care provider.  

## 2015-01-04 NOTE — ED Notes (Signed)
Pt verbalized understanding of d/c instructions and has no further questions. Pt stable and NAD.  

## 2015-01-04 NOTE — ED Provider Notes (Signed)
CSN: [id number]     Arrival date & time 01/04/15  1421 History   First MD Initiated Contact with Patient 01/04/15 1642     Chief Complaint  Patient presents with  . Back Pain     (Consider location/radiation/quality/duration/timing/severity/associated sxs/prior Treatment) Patient is a 49 y.o. male presenting with back pain.  Back Pain Location:  Lumbar spine Quality:  Shooting Radiates to:  L knee Pain severity:  Severe Onset quality:  Gradual Duration:  4 days Timing:  Constant Progression:  Worsening Chronicity:  Recurrent Context comment:  Went to a chiropractor yesterday and subsequently he feels that he has developed muscle spasms in his back Relieved by: flexeril, laying on stomach. Exacerbated by: moving, standing, turning, walking. Ineffective treatments: does of ibuprofen yesterday. Associated symptoms: no bladder incontinence, no bowel incontinence, no fever, no numbness, no paresthesias, no perianal numbness and no weakness     Past Medical History  Diagnosis Date  . HLD (hyperlipidemia)   . Headache(784.0)   . Chest pain     cath 12/31/10: post AV CFX < 10% (no significant CAD), EF 60%   . Family history of ischemic heart disease   . Lipoma     Dr. Greer Pickerel, consult 2013, pending surgery  . Palpitations     recheck with cardiology 02/2012.  Marland Kitchen Hx of echocardiogram Aug 2012    Normal  . GERD (gastroesophageal reflux disease)     intermittent  . Peyronie disease     Urology consult prior  . Obesity    Past Surgical History  Procedure Laterality Date  . Cardiac catheterization  12/2010    Dr. Marigene Ehlers   . Stye surgery     Family History  Problem Relation Age of Onset  . Asthma Mother   . Mental illness Mother   . Heart disease Mother     died of CHF  . Arthritis Father   . Diabetes Father   . Heart disease Father     heart transplant, died of heart disease 5 years later  . Arthritis Paternal Aunt   . Diabetes Paternal Aunt   . Arthritis Paternal  Uncle   . Diabetes Paternal Uncle   . Hypertension Brother   . Clotting disorder Maternal Aunt   . Cancer Maternal Grandmother   . Stroke Neg Hx    History  Substance Use Topics  . Smoking status: Former Smoker -- 0.00 packs/day for .5 years    Quit date: 06/08/2006  . Smokeless tobacco: Not on file  . Alcohol Use: 0.6 oz/week    1 Shots of liquor per week     Comment: occasionally    Review of Systems  Constitutional: Negative for fever.  Gastrointestinal: Negative for bowel incontinence.  Genitourinary: Negative for bladder incontinence.  Musculoskeletal: Positive for back pain.  Neurological: Negative for weakness, numbness and paresthesias.  All other systems reviewed and are negative.     Allergies  Iodinated diagnostic agents  Home Medications   Prior to Admission medications   Medication Sig Start Date End Date Taking? Authorizing Provider  lovastatin (MEVACOR) 40 MG tablet Take 1 tablet (40 mg total) by mouth at bedtime. 05/24/14  Yes [name], MD   BP 121/70 mmHg  Pulse 69  Temp(Src) 98.8 F (37.1 C) (Oral)  Resp 20  Ht 6' (1.829 m)  Wt 230 lb (104.327 kg)  BMI 31.19 kg/m2  SpO2 96% Physical Exam  Constitutional: He is oriented to person, place, and time. He appears well-developed  and well-nourished. No distress.  HENT:  Head: Normocephalic and atraumatic.  Eyes: Conjunctivae are normal. No scleral icterus.  Neck: Neck supple.  Cardiovascular: Normal rate and intact distal pulses.   Pulmonary/Chest: Effort normal. No stridor. No respiratory distress.  Abdominal: Normal appearance. He exhibits no distension.  Musculoskeletal:       Right hip: He exhibits normal strength, no tenderness and no bony tenderness.       Lumbar back: He exhibits no tenderness and no bony tenderness.  Neurological: He is alert and oriented to person, place, and time. Gait normal.  Reflex Scores:      Patellar reflexes are 1+ on the right side and 1+ on the left  side. Normal lower extremity strength and sensation.    Skin: Skin is warm and dry. No rash noted.  Psychiatric: He has a normal mood and affect. His behavior is normal.  Nursing note and vitals reviewed.   ED Course  Procedures (including critical care time) Labs Review Labs Reviewed  URINALYSIS, ROUTINE W REFLEX MICROSCOPIC (NOT AT [facility])    Imaging Review No results found.   EKG Interpretation None      MDM   Final diagnoses:  Right-sided low back pain with right-sided sciatica    49 yo male with hx of DDD presenting with low back pain with radiation to right knee.  No red flag signs or symptoms of emergent spinal pathology.  I don't think he needs any imaging today.  He went to a chiropractor yesterday and was manipulated, but did not develop worsening pain until some time after his session.  Plan IM toradol, PO flexeril\.  Pain better.  UA negative.  Plan dc with flexeril and NSAIDs  Serita Grit, MD 01/04/15 2102

## 2015-01-04 NOTE — ED Notes (Signed)
Onset 4 days ago lower back pain getting out of bathtub and seen Chiropractor one day ago. After adjustment 1700 one day ago lower back pain increased pain with right lower extremity tingling and muscle spasms.

## 2015-01-04 NOTE — Telephone Encounter (Signed)
Pt states he was seen like 6 months ago here for hernia disc pain and would like something for pain. Send med to [facility].

## 2015-01-04 NOTE — Telephone Encounter (Signed)
Pt called back and states it has gotten so bad that he is headed to the ER right now

## 2015-01-04 NOTE — Telephone Encounter (Signed)
Is still giving problems, needs to recheck.  What is he taking now, what worked last time?

## 2015-01-04 NOTE — ED Notes (Signed)
MD at bedside. 

## 2015-08-13 ENCOUNTER — Other Ambulatory Visit: Payer: Self-pay | Admitting: Family Medicine

## 2015-08-14 NOTE — Telephone Encounter (Signed)
Dr Redmond School is this ok for refill?  Name from pharmacy:  In chart as:  LOVASTATIN 40MG  TAB lovastatin (MEVACOR) 40 MG tablet     Sig: TAKE ONE TABLET BY MOUTH AT BEDTIME    Dispense: 90 tablet   Refills: 0   Start: 08/13/2015   Class: Normal    Requested on: 05/24/2014    Originally ordered on: 04/10/2013

## 2015-10-17 ENCOUNTER — Ambulatory Visit (INDEPENDENT_AMBULATORY_CARE_PROVIDER_SITE_OTHER): Payer: BC Managed Care – PPO | Admitting: Medical

## 2015-10-17 ENCOUNTER — Encounter: Payer: Self-pay | Admitting: Medical

## 2015-10-17 VITALS — BP 128/82 | HR 92 | Temp 97.5°F | Wt 228.0 lb

## 2015-10-17 DIAGNOSIS — J0110 Acute frontal sinusitis, unspecified: Secondary | ICD-10-CM

## 2015-10-17 DIAGNOSIS — H1132 Conjunctival hemorrhage, left eye: Secondary | ICD-10-CM | POA: Diagnosis not present

## 2015-10-17 DIAGNOSIS — D171 Benign lipomatous neoplasm of skin and subcutaneous tissue of trunk: Secondary | ICD-10-CM | POA: Diagnosis not present

## 2015-10-17 MED ORDER — AMOXICILLIN 875 MG PO TABS: 875.0000 mg | ORAL_TABLET | Freq: Two times a day (BID) | ORAL | Status: DC

## 2015-10-17 NOTE — Progress Notes (Signed)
Subjective: Chief Complaint  Patient presents with  . Sinus pressure    blood vessel popped in eye, sore throat. also wants to discuss referral to [facility].   Here for sinus pressure x 1 week, headache, some irritated throat, particularly in the morning, feels like head is full of mucous, using some mucinex.   No ear pain, no NVD.   No cough, no teeth pain.   Has had blood shot eye like blood vessel popped in left eye.   Has had some sick contacts.   Has lipoma on back, wants referral to surgery.  Has lipoma in center of low back, has gotten bigger over time, seems to bother him, wants it cut out.  Saw surgery for this prior, but couldn't afford the surgery then.  Wants updated consult. No other aggravating or relieving factors. No other complaint.  Past Medical History  Diagnosis Date  . HLD (hyperlipidemia)   . Headache(784.0)   . Chest pain     cath 12/31/10: post AV CFX < 10% (no significant CAD), EF 60%   . Family history of ischemic heart disease   . Lipoma     Dr. Greer Pickerel, consult 2013, pending surgery  . Palpitations     recheck with cardiology 02/2012.  Marland Kitchen Hx of echocardiogram Aug 2012    Normal  . GERD (gastroesophageal reflux disease)     intermittent  . Peyronie disease     Urology consult prior  . Obesity    ROS as in subjective   Objective: BP 128/82 mmHg  Pulse 92  Temp(Src) 97.5 F (36.4 C) (Tympanic)  Wt 228 lb (103.42 kg)  General appearance: Alert, WD/WN, no distress                             Skin: warm, no rash                           Head: +frontal sinus tenderness,                            Eyes: conjunctiva normal, corneas clear, PERRLA                            Ears: pearly TMs, external ear canals normal                          Nose: septum midline, turbinates swollen, with erythema and clear discharge             Mouth/throat: MMM, tongue normal, mild pharyngeal erythema                           Neck: supple, no adenopathy, no thyromegaly, non  tender                          Lungs: CTA bilaterally, no wheezes, rales, or rhonchi    midline low back with large broad round lipoma mass, nontender     Assessment: Encounter Diagnoses  Name Primary?  . Acute frontal sinusitis, recurrence not specified Yes  . Subconjunctival hemorrhage of left eye   . Lipoma of back      Plan: Sinusitis - begin amoxicillin, rest, hydrate, c/t mucinex, and  if worse or not improving within the next week, the cal or return  Subconjunctival hemorrhage - resolved  Lipoma- referral back to general surgery

## 2015-10-21 ENCOUNTER — Telehealth: Payer: Self-pay | Admitting: Internal Medicine

## 2015-10-21 NOTE — Telephone Encounter (Signed)
Pt eye is still blood shot and not gotten any better. Pt thought it would be better after antibiotic but its not. Please advise

## 2015-10-22 NOTE — Telephone Encounter (Signed)
Pt stated that his eye is just blood shot, not itchy or leaking anything. Denies any swelling.

## 2015-10-22 NOTE — Telephone Encounter (Signed)
LMTCB

## 2015-10-22 NOTE — Telephone Encounter (Signed)
From my exam notes it didn't look too bad and these things usually take a week or 2 to resolve.   However, if red/itchy eyes, he may also have some allergy issues and an OTC allergy eye drop would help.   If goupy or matted eyes, then let me know.

## 2015-10-22 NOTE — Telephone Encounter (Signed)
Ok , then give it a little more time to resolve

## 2015-10-22 NOTE — Telephone Encounter (Signed)
Left detailed on pts personal VM

## 2015-11-28 ENCOUNTER — Encounter: Payer: BC Managed Care – PPO | Admitting: Family Medicine

## 2015-12-24 ENCOUNTER — Ambulatory Visit (INDEPENDENT_AMBULATORY_CARE_PROVIDER_SITE_OTHER): Payer: BC Managed Care – PPO | Admitting: Family Medicine

## 2015-12-24 ENCOUNTER — Encounter: Payer: Self-pay | Admitting: Family Medicine

## 2015-12-24 VITALS — BP 116/86 | HR 76 | Ht 72.0 in | Wt 227.0 lb

## 2015-12-24 DIAGNOSIS — Z8249 Family history of ischemic heart disease and other diseases of the circulatory system: Secondary | ICD-10-CM | POA: Diagnosis not present

## 2015-12-24 DIAGNOSIS — Z0000 Encounter for general adult medical examination without abnormal findings: Secondary | ICD-10-CM | POA: Diagnosis not present

## 2015-12-24 DIAGNOSIS — E785 Hyperlipidemia, unspecified: Secondary | ICD-10-CM | POA: Diagnosis not present

## 2015-12-24 DIAGNOSIS — K219 Gastro-esophageal reflux disease without esophagitis: Secondary | ICD-10-CM | POA: Insufficient documentation

## 2015-12-24 DIAGNOSIS — E669 Obesity, unspecified: Secondary | ICD-10-CM

## 2015-12-24 DIAGNOSIS — N486 Induration penis plastica: Secondary | ICD-10-CM

## 2015-12-24 DIAGNOSIS — D171 Benign lipomatous neoplasm of skin and subcutaneous tissue of trunk: Secondary | ICD-10-CM

## 2015-12-24 LAB — POCT URINALYSIS DIPSTICK
Bilirubin, UA: NEGATIVE
Blood, UA: NEGATIVE
GLUCOSE UA: NEGATIVE
Ketones, UA: NEGATIVE
LEUKOCYTES UA: NEGATIVE
Nitrite, UA: NEGATIVE
Protein, UA: NEGATIVE
Spec Grav, UA: >=1.03
UROBILINOGEN UA: NEGATIVE
pH, UA: 6

## 2015-12-24 MED ORDER — LOVASTATIN 40 MG PO TABS: 40.0000 mg | ORAL_TABLET | Freq: Every day | ORAL | Status: DC

## 2015-12-24 NOTE — Progress Notes (Signed)
Subjective:    Patient ID: Aaron Grim., male    DOB: Sep 02, 1965, 50 y.o.   MRN: [id number]  HPI He is here for complete examination. He continues on Mevacor and having no difficulty with that. He does have a family history of heart disease with his father having heart disease in his 54s. He also does have a history of Peyronie's disease but has not followed up with urology concerning this and several years. He is interested in losing weight mainly to help with potentially getting a better refereeing position. 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 He also has a lipoma on his back and is scheduled to have this removed. It is in the midportion of his low back and is causing mechanical issues. He does occasionally have difficulty with reflux and does use Prilosec without difficulty.Family and social history as well as health maintenance and immunizations were reviewed. He has no other concerns or complaints.   Review of Systems  All other systems reviewed and are negative.      Objective:   Physical Exam BP 116/86 mmHg  Pulse 76  Ht 6' (1.829 m)  Wt 227 lb (102.967 kg)  BMI 30.78 kg/m2  General Appearance:    Alert, cooperative, no distress, appears stated age  Head:    Normocephalic, without obvious abnormality, atraumatic  Eyes:    PERRL, conjunctiva/corneas clear, EOM's intact, fundi    benign  Ears:    Normal TM's and external ear canals  Nose:   Nares normal, mucosa normal, no drainage or sinus   tenderness  Throat:   Lips, mucosa, and tongue normal; teeth and gums normal  Neck:   Supple, no lymphadenopathy;  thyroid:  no   enlargement/tenderness/nodules; no carotid   bruit or JVD  Back:    Spine nontender, no curvature, ROM normal, no CVA     tenderness  Lungs:     Clear to auscultation bilaterally without wheezes, rales or     ronchi; respirations unlabored  Chest Wall:    No tenderness or deformity   Heart:    Regular rate and rhythm, S1 and S2  normal, no murmur, rub   or gallop     Abdomen:     Soft, non-tender, nondistended, normoactive bowel sounds,    no masses, no hepatosplenomegaly  Genitalia:    Normal male external genitalia without lesions.  Testicles without masses.  No inguinal hernias.     Extremities:   No clubbing, cyanosis or edema  Pulses:   2+ and symmetric all extremities  Skin:   Skin color, texture, turgor normal, no rashes or lesions  Lymph nodes:   Cervical, supraclavicular, and axillary nodes normal  Neurologic:   CNII-XII intact, normal strength, sensation and gait; reflexes 2+ and symmetric throughout          Psych:   Normal mood, affect, hygiene and grooming.          Assessment & Plan:  Routine general medical examination at a health care facility - Plan: POCT Urinalysis Dipstick, CBC with Differential/Platelet, Comprehensive metabolic panel, Lipid panel  Peyronie's disease - Plan: Ambulatory referral to Urology  HLD (hyperlipidemia)  Obesity  Lipoma of back  Family history of heart disease in male family member before age 53 Discussed weight loss with him in regard to diet and exercise. Recommend more frequent but smaller meals as well as a exercise program of lower intensity longer duration. Will also refer to urology for more  definitive care of the Perrone's disease.

## 2015-12-25 ENCOUNTER — Ambulatory Visit: Payer: Self-pay | Admitting: General Surgery

## 2015-12-25 LAB — CBC WITH DIFFERENTIAL/PLATELET
BASOS PCT: 1 %
Basophils Absolute: 63 cells/uL (ref 0–200)
EOS ABS: 126 {cells}/uL (ref 15–500)
EOS PCT: 2 %
HCT: 44 % (ref 38.5–50.0)
Hemoglobin: 15.1 g/dL (ref 13.2–17.1)
LYMPHS ABS: 1827 {cells}/uL (ref 850–3900)
LYMPHS PCT: 29 %
MCH: 30.6 pg (ref 27.0–33.0)
MCHC: 34.3 g/dL (ref 32.0–36.0)
MCV: 89.2 fL (ref 80.0–100.0)
MONOS PCT: 8 %
MPV: 9 fL (ref 7.5–12.5)
Monocytes Absolute: 504 cells/uL (ref 200–950)
NEUTROS ABS: 3780 {cells}/uL (ref 1500–7800)
Neutrophils Relative %: 60 %
Platelets: 304 10*3/uL (ref 140–400)
RBC: 4.93 MIL/uL (ref 4.20–5.80)
RDW: 14 % (ref 11.0–15.0)
WBC: 6.3 10*3/uL (ref 4.0–10.5)

## 2015-12-25 LAB — LIPID PANEL
CHOL/HDL RATIO: 3.2 ratio (ref <=5.0)
CHOLESTEROL: 179 mg/dL (ref 125–200)
HDL: 56 mg/dL (ref >=40)
LDL CALC: 105 mg/dL (ref <130)
Triglycerides: 91 mg/dL (ref <150)
VLDL: 18 mg/dL (ref <30)

## 2015-12-25 LAB — COMPREHENSIVE METABOLIC PANEL
ALT: 19 U/L (ref 9–46)
AST: 20 U/L (ref 10–40)
Albumin: 4.3 g/dL (ref 3.6–5.1)
Alkaline Phosphatase: 89 U/L (ref 40–115)
BUN: 15 mg/dL (ref 7–25)
CO2: 26 mmol/L (ref 20–31)
CREATININE: 0.98 mg/dL (ref 0.60–1.35)
Calcium: 9 mg/dL (ref 8.6–10.3)
Chloride: 102 mmol/L (ref 98–110)
Glucose, Bld: 88 mg/dL (ref 65–99)
Potassium: 4.4 mmol/L (ref 3.5–5.3)
SODIUM: 138 mmol/L (ref 135–146)
TOTAL PROTEIN: 7.1 g/dL (ref 6.1–8.1)
Total Bilirubin: 0.7 mg/dL (ref 0.2–1.2)

## 2015-12-25 NOTE — H&P (Signed)
Aaron Patel 12/05/2015 8:42 AM Location: [facility] Patient #: [id number] DOB: [birth year] Married / Language: English / Race: Black or African American Male   History of Present Illness Aaron Patel M. Aaron Brinkmeier Patel; 12/05/2015 8:57 AM) Patient words: Recheck lipoma on back.  The patient is a 50 year old male who presents with a soft tissue mass. He comes back in to discuss his lower back lipoma. I initially met him in 2013 and again in June 2016. He states that he is now ready to proceed with surgery for his lipoma. He believes has gotten a little bit larger since last summer. He also complains that it may be causing a little bit more soreness in his back. However there is no radiculopathy. He denies any lymphadenopathy, night sweats, fever, chills, weight loss. He denies any other new soft tissue masses. He denies any significant medical changes since his last visit 1 year ago. He denies any blood thinners. He does not smoke.  11/2014 He is referred by Dr Aaron Patel for evaluation of a lipoma on his lower back. I initially met him in 2013. He was unable to schedule surgery at that time. He states that nothing is changed about it. He states that it might be a little bit larger. He states he is now starting to have some musculoskeletal discomfort in his left lower back. He is unsure if it's related to the lipoma. He denies any radiculopathy. He denies any weight change. He denies any fevers or chills. He denies any redness or drainage or over the area. He denies any swollen glands. He denies any chest pain, chest pressure, shortness of breath, TIAs or amaurosis fugax. He denies smoking.   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 Medications Reconciled  Social History Aaron Patel; 12/05/2015 8:57 AM) Caffeine use Tea. Alcohol use Occasional alcohol use. Tobacco use Never smoker. No drug use  Family History Aaron Patel; 12/05/2015 8:57 AM) Heart disease in male family member before age 67 Heart Disease Father, Mother. Diabetes Mellitus Father.    Review of Systems Aaron Patel M. Aaron Swearengin Patel; 12/05/2015 8:55 AM) General Not Present- Appetite Loss, Chills, Fatigue, Fever, Night Sweats, Weight Gain and Weight Loss. Skin Not Present- Change in Wart/Mole, Dryness, Hives, Jaundice, New Lesions, Non-Healing Wounds, Rash and Ulcer. HEENT Present- Wears glasses/contact lenses. Not Present- Earache, Hearing Loss, Hoarseness, Nose Bleed, Oral Ulcers, Ringing in the Ears, Seasonal Allergies, Sinus Pain, Sore Throat, Visual Disturbances and Yellow Eyes. Respiratory Not Present- Bloody sputum, Chronic Cough, Difficulty Breathing, Snoring and Wheezing. Cardiovascular Not Present- Chest Pain, Difficulty Breathing Lying Down, Leg Cramps, Palpitations, Rapid Heart Rate, Shortness of Breath and Swelling of Extremities. Gastrointestinal Not Present- Abdominal Pain, Bloating, Bloody Stool, Change in Bowel Habits, Chronic diarrhea, Constipation, Difficulty Swallowing, Excessive gas, Gets full quickly at meals, Hemorrhoids, Indigestion, Nausea, Rectal Pain and Vomiting. Male Genitourinary Not Present- Blood in Urine, Change in Urinary Stream, Frequency, Impotence, Nocturia, Painful Urination, Urgency and Urine Leakage. Musculoskeletal Present- Back Pain and  Muscle Pain. Not Present- Joint Pain,  Joint Stiffness, Muscle Weakness and Swelling of Extremities. Neurological Not Present- Decreased Memory, Fainting, Headaches, Numbness, Seizures, Tingling, Tremor, Trouble walking and Weakness. Psychiatric Not Present- Anxiety, Bipolar, Change in Sleep Pattern, Depression, Fearful and Frequent crying. Endocrine Not Present- Cold Intolerance, Excessive Hunger, Hair Changes, Heat Intolerance and New Diabetes.  Vitals Aaron Patel Patel; 12/05/2015 8:44 AM) 12/05/2015 8:44 AM Weight: 228.2 lb Height: 72in Body Surface Area: 2.25 m Body Mass Index: 30.95 kg/m  Temp.: 98.27F  Pulse: 73 (Regular)  BP: 122/88 (Sitting, Left Arm, Standard)       Physical Exam Aaron Patel M. Aaron Seyer Patel; 12/05/2015 8:55 AM) General Mental Status-Alert. General Appearance-Consistent with stated age. Hydration-Well hydrated. Voice-Normal.  Integumentary Note: mid lower back (lumbar) well circumscribed mobile soft tissue mass 9 x 9 cm, nontender, no overlying skin lesion, no surrounding LAD   Head and Neck Head-normocephalic, atraumatic with no lesions or palpable masses. Trachea-midline. Thyroid Gland Characteristics - normal size and consistency.  Eye Eyeball - Bilateral-Extraocular movements intact. Sclera/Conjunctiva - Bilateral-No scleral icterus.  Chest and Lung Exam Chest and lung exam reveals -quiet, even and easy respiratory effort with no use of accessory muscles and on auscultation, normal breath sounds, no adventitious sounds and normal vocal resonance. Inspection Chest Wall - Normal. Back - normal.  Breast - Did not examine.  Cardiovascular Cardiovascular examination reveals -normal heart sounds, regular rate and rhythm with no murmurs and normal pedal pulses bilaterally.  Abdomen Inspection Inspection of the abdomen reveals - No Hernias. Skin - Scar - no surgical scars. Palpation/Percussion Palpation and Percussion of the abdomen reveal - Soft, Non  Tender, No Rebound tenderness, No Rigidity (guarding) and No hepatosplenomegaly. Auscultation Auscultation of the abdomen reveals - Bowel sounds normal.  Peripheral Vascular Upper Extremity Palpation - Pulses bilaterally normal.  Neurologic Neurologic evaluation reveals -alert and oriented x 3 with no impairment of recent or remote memory. Mental Status-Normal.  Neuropsychiatric The patient's mood and affect are described as -normal. Judgment and Insight-insight is appropriate concerning matters relevant to self.  Musculoskeletal Normal Exam - Left-Upper Extremity Strength Normal and Lower Extremity Strength Normal. Normal Exam - Right-Upper Extremity Strength Normal and Lower Extremity Strength Normal.  Lymphatic Head & Neck  General Head & Neck Lymphatics: Bilateral - Description - Normal. Axillary -Note: no LAD.  Femoral & Inguinal - Did not examine.    Assessment & Plan Aaron Patel M. 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 The patient was given educational material. We discussed that the majority of lipomas are benign although on a rare occasion it can be malignant.  We discussed observation versus surgical excision. We discussed the risks and benefits of surgery including but not limited to bleeding, infection, injury to surrounding structures, scarring, seroma/hematoma, cosmetic concerns, possible temporary drain placement, blood clot formation, anesthesia issues, possible recurrence, and the typical postoperative course.  The patient has elected to proceed with EXCISION OF LOWER BACK LIPOMA Current Plans You are being scheduled for surgery - Our schedulers will call you.  You should hear from our office's scheduling department within 5 working days about the location, date, and time of surgery. We try to make accommodations for patient's preferences in scheduling surgery, but  sometimes the OR schedule or the surgeon's schedule prevents [place] from making those accommodations.  If you have not heard from our office [phone]) in 5 working days, call the office and ask for your  surgeon's nurse.  If you have other questions about your diagnosis, plan, or surgery, call the office and ask for your surgeon's nurse.  Pt Education - [facility] Education - Written Instructions given: discussed with patient and provided information.  Leighton Ruff. Aaron Pulling, Patel, FACS General, Bariatric, & Minimally Invasive Surgery [facility], [place]

## 2015-12-25 NOTE — Progress Notes (Signed)
Spoke with Sunday Spillers to make MD aware to enters orders.

## 2015-12-26 NOTE — Pre-Procedure Instructions (Signed)
Aaron Patel.  12/26/2015      [place], [place] - [address] [place] [address] Phone: [phone] Fax: [phone]    Your procedure is scheduled on Friday, July 28.  Report to [facility] Admitting at 10:20 A.M.                Your surgery or procedure is scheduled for 12:20 PM   Call this number if you have problems the morning of surgery:[phone]                For any other questions, please call [phone], Monday - Friday 8 AM - 4 PM.   Remember:  Do not eat food or drink liquids after midnight Thursday, July 27.  Take these medicines the morning of surgery with A SIP OF WATER :None            Starting today, STOP taking any: Aspirin, Aleve, Naproxen, Ibuprofen, Motrin, Advil, Goody's, BC's, all herbal medications, fish oil, and all vitamins (multivitamins)    Do not wear jewelry  Do not wear lotions, powders, or colognes   Men may shave face and neck.  Do not bring valuables to the hospital.  [facility] is not responsible for any belongings or valuables.  Contacts, dentures or bridgework may not be worn into surgery.  Leave your suitcase in the car.  After surgery it may be brought to your room.  For patients admitted to the hospital, discharge time will be determined by your treatment team.  Patients discharged the day of surgery will not be allowed to drive home.     Special instructions:    Aaron Patel- Preparing For Surgery  Before surgery, you can play an important role. Because skin is not sterile, your skin needs to be as free of germs as possible. You can reduce the number of germs on your skin by washing with CHG (chlorahexidine gluconate) Soap before surgery.  CHG is an antiseptic cleaner which kills germs and bonds with the skin to continue killing germs even after washing.  Please do not use if you have an allergy to CHG or antibacterial soaps. If your skin  becomes reddened/irritated stop using the CHG.  Do not shave (including legs and underarms) for at least 48 hours prior to first CHG shower. It is OK to shave your face.  Please follow these instructions carefully.   1. Shower the NIGHT BEFORE SURGERY and the MORNING OF SURGERY with CHG.   2. If you chose to wash your hair, wash your hair first as usual with your normal shampoo.  3. After you shampoo, rinse your hair and body thoroughly to remove the shampoo.  4. Use CHG as you would any other liquid soap. You can apply CHG directly to the skin and wash gently with a scrungie or a clean washcloth.   5. Apply the CHG Soap to your body ONLY FROM THE NECK DOWN.  Do not use on open wounds or open sores. Avoid contact with your eyes, ears, mouth and genitals (private parts). Wash genitals (private parts) with your normal soap.  6. Wash thoroughly, paying special attention to the area where your surgery will be performed.  7. Thoroughly rinse your body with warm water from the neck down.  8. DO NOT shower/wash with your normal soap after using and rinsing off the CHG Soap.  9. Pat yourself dry with a CLEAN TOWEL.  10. Wear CLEAN PAJAMAS   11. Place CLEAN SHEETS on your bed the night of your first shower and DO NOT SLEEP WITH PETS.    Day of Surgery: Do not apply any deodorants/lotions. Please wear clean clothes to the hospital/surgery center.      Please read over the following fact sheets that you were given. Pain Booklet and Coughing and Deep Breathing

## 2015-12-27 ENCOUNTER — Encounter (HOSPITAL_COMMUNITY)
Admission: RE | Admit: 2015-12-27 | Discharge: 2015-12-27 | Disposition: A | Discharge status: Home / Self Care | Payer: BC Managed Care – PPO | Source: Ambulatory Visit | Attending: General Surgery | Admitting: General Surgery

## 2015-12-27 ENCOUNTER — Encounter (HOSPITAL_COMMUNITY): Payer: Self-pay

## 2015-12-27 DIAGNOSIS — Z01812 Encounter for preprocedural laboratory examination: Secondary | ICD-10-CM | POA: Insufficient documentation

## 2015-12-27 DIAGNOSIS — D171 Benign lipomatous neoplasm of skin and subcutaneous tissue of trunk: Secondary | ICD-10-CM | POA: Diagnosis not present

## 2015-12-27 LAB — CBC
HCT: 41.4 % (ref 39.0–52.0)
HEMOGLOBIN: 14.5 g/dL (ref 13.0–17.0)
MCH: 30.6 pg (ref 26.0–34.0)
MCHC: 35 g/dL (ref 30.0–36.0)
MCV: 87.3 fL (ref 78.0–100.0)
PLATELETS: 285 10*3/uL (ref 150–400)
RBC: 4.74 MIL/uL (ref 4.22–5.81)
RDW: 12.7 % (ref 11.5–15.5)
WBC: 6 10*3/uL (ref 4.0–10.5)

## 2015-12-27 NOTE — Progress Notes (Signed)
PCP: Jill Alexanders Pt with no current cardiologist, but saw Dr. Aundra Dubin in 2012/2013 for cardiac workup.  Echo: 01/09/11 Stress test: 05/10/13 Cath: 01/01/11  Pt with no complaints of chest pain, shortness of breath, fever, cough at this time

## 2016-01-02 MED ORDER — ACETAMINOPHEN 500 MG PO TABS: 1000.0000 mg | ORAL_TABLET | ORAL | Status: DC

## 2016-01-02 MED ORDER — CEFAZOLIN SODIUM-DEXTROSE 2-4 GM/100ML-% IV SOLN
2.0000 g | INTRAVENOUS | Status: AC
  Administered 2016-01-03: 2 g via INTRAVENOUS

## 2016-01-03 ENCOUNTER — Ambulatory Visit (HOSPITAL_COMMUNITY): Payer: BC Managed Care – PPO | Admitting: Certified Registered Nurse Anesthetist

## 2016-01-03 ENCOUNTER — Ambulatory Visit (HOSPITAL_COMMUNITY)
Admission: RE | Admit: 2016-01-03 | Discharge: 2016-01-03 | Disposition: A | Discharge status: Home / Self Care | Payer: BC Managed Care – PPO | Source: Ambulatory Visit | Attending: General Surgery | Admitting: General Surgery

## 2016-01-03 ENCOUNTER — Encounter (HOSPITAL_COMMUNITY)
Admission: RE | Disposition: A | Discharge status: Home / Self Care | Payer: Self-pay | Source: Ambulatory Visit | Attending: General Surgery

## 2016-01-03 ENCOUNTER — Encounter (HOSPITAL_COMMUNITY): Payer: Self-pay | Admitting: *Deleted

## 2016-01-03 DIAGNOSIS — D171 Benign lipomatous neoplasm of skin and subcutaneous tissue of trunk: Secondary | ICD-10-CM | POA: Diagnosis not present

## 2016-01-03 HISTORY — PX: LIPOMA EXCISION: SHX5283

## 2016-01-03 SURGERY — EXCISION LIPOMA
Anesthesia: General | Site: Abdomen

## 2016-01-03 MED ORDER — LIDOCAINE 2% (20 MG/ML) 5 ML SYRINGE
INTRAMUSCULAR | Status: DC | PRN
  Administered 2016-01-03: 60 mg via INTRAVENOUS

## 2016-01-03 MED ORDER — SUGAMMADEX SODIUM 200 MG/2ML IV SOLN
INTRAVENOUS | Status: AC
  Filled 2016-01-03: qty 2

## 2016-01-03 MED ORDER — CHLORHEXIDINE GLUCONATE CLOTH 2 % EX PADS: 6.0000 | MEDICATED_PAD | Freq: Once | CUTANEOUS | Status: DC

## 2016-01-03 MED ORDER — BUPIVACAINE-EPINEPHRINE (PF) 0.25% -1:200000 IJ SOLN
INTRAMUSCULAR | Status: AC
  Filled 2016-01-03: qty 30

## 2016-01-03 MED ORDER — PROPOFOL 10 MG/ML IV BOLUS
INTRAVENOUS | Status: AC
  Filled 2016-01-03: qty 20

## 2016-01-03 MED ORDER — PHENYLEPHRINE HCL 10 MG/ML IJ SOLN
INTRAMUSCULAR | Status: DC | PRN
  Administered 2016-01-03 (×2): 80 ug via INTRAVENOUS

## 2016-01-03 MED ORDER — HYDROMORPHONE HCL 1 MG/ML IJ SOLN: 0.2500 mg | INTRAMUSCULAR | Status: DC | PRN

## 2016-01-03 MED ORDER — LIDOCAINE 2% (20 MG/ML) 5 ML SYRINGE
INTRAMUSCULAR | Status: AC
  Filled 2016-01-03: qty 10

## 2016-01-03 MED ORDER — 0.9 % SODIUM CHLORIDE (POUR BTL) OPTIME
TOPICAL | Status: DC | PRN
  Administered 2016-01-03: 1000 mL

## 2016-01-03 MED ORDER — ONDANSETRON HCL 4 MG/2ML IJ SOLN
INTRAMUSCULAR | Status: AC
  Filled 2016-01-03: qty 2

## 2016-01-03 MED ORDER — MIDAZOLAM HCL 2 MG/2ML IJ SOLN
INTRAMUSCULAR | Status: AC
  Filled 2016-01-03: qty 2

## 2016-01-03 MED ORDER — PROPOFOL 10 MG/ML IV BOLUS
INTRAVENOUS | Status: DC | PRN
  Administered 2016-01-03: 170 mg via INTRAVENOUS

## 2016-01-03 MED ORDER — ROCURONIUM BROMIDE 100 MG/10ML IV SOLN
INTRAVENOUS | Status: DC | PRN
  Administered 2016-01-03: 50 mg via INTRAVENOUS

## 2016-01-03 MED ORDER — LACTATED RINGERS IV SOLN
INTRAVENOUS | Status: DC
  Administered 2016-01-03 (×2): via INTRAVENOUS

## 2016-01-03 MED ORDER — PROMETHAZINE HCL 25 MG/ML IJ SOLN
6.2500 mg | INTRAMUSCULAR | Status: DC | PRN
Start: 2016-01-03 — End: 2016-01-03

## 2016-01-03 MED ORDER — EPHEDRINE 5 MG/ML INJ
INTRAVENOUS | Status: AC
  Filled 2016-01-03: qty 10

## 2016-01-03 MED ORDER — BACITRACIN ZINC 500 UNIT/GM EX OINT
TOPICAL_OINTMENT | CUTANEOUS | Status: DC | PRN
  Administered 2016-01-03: 1 via TOPICAL

## 2016-01-03 MED ORDER — PHENYLEPHRINE 40 MCG/ML (10ML) SYRINGE FOR IV PUSH (FOR BLOOD PRESSURE SUPPORT)
PREFILLED_SYRINGE | INTRAVENOUS | Status: AC
  Filled 2016-01-03: qty 10

## 2016-01-03 MED ORDER — FENTANYL CITRATE (PF) 250 MCG/5ML IJ SOLN
INTRAMUSCULAR | Status: DC | PRN
  Administered 2016-01-03: 150 ug via INTRAVENOUS

## 2016-01-03 MED ORDER — SUGAMMADEX SODIUM 200 MG/2ML IV SOLN
INTRAVENOUS | Status: DC | PRN
  Administered 2016-01-03: 200 mg via INTRAVENOUS

## 2016-01-03 MED ORDER — KETOROLAC TROMETHAMINE 30 MG/ML IJ SOLN
INTRAMUSCULAR | Status: DC | PRN
  Administered 2016-01-03: 30 mg via INTRAVENOUS

## 2016-01-03 MED ORDER — CEFAZOLIN SODIUM 1 G IJ SOLR
INTRAMUSCULAR | Status: AC
  Filled 2016-01-03: qty 20

## 2016-01-03 MED ORDER — ACETAMINOPHEN 10 MG/ML IV SOLN
INTRAVENOUS | Status: AC
  Filled 2016-01-03: qty 100

## 2016-01-03 MED ORDER — OXYCODONE HCL 5 MG PO TABS: 5.0000 mg | ORAL_TABLET | Freq: Four times a day (QID) | ORAL | 0 refills | Status: DC | PRN

## 2016-01-03 MED ORDER — FENTANYL CITRATE (PF) 250 MCG/5ML IJ SOLN
INTRAMUSCULAR | Status: AC
  Filled 2016-01-03: qty 5

## 2016-01-03 MED ORDER — BUPIVACAINE-EPINEPHRINE 0.25% -1:200000 IJ SOLN
INTRAMUSCULAR | Status: DC | PRN
  Administered 2016-01-03: 30 mL

## 2016-01-03 MED ORDER — ROCURONIUM BROMIDE 50 MG/5ML IV SOLN
INTRAVENOUS | Status: AC
  Filled 2016-01-03: qty 1

## 2016-01-03 MED ORDER — KETOROLAC TROMETHAMINE 30 MG/ML IJ SOLN
INTRAMUSCULAR | Status: AC
  Filled 2016-01-03: qty 1

## 2016-01-03 MED ORDER — ONDANSETRON HCL 4 MG/2ML IJ SOLN
INTRAMUSCULAR | Status: DC | PRN
  Administered 2016-01-03: 4 mg via INTRAVENOUS

## 2016-01-03 MED ORDER — MIDAZOLAM HCL 5 MG/5ML IJ SOLN
INTRAMUSCULAR | Status: DC | PRN
  Administered 2016-01-03: 2 mg via INTRAVENOUS

## 2016-01-03 SURGICAL SUPPLY — 52 items
BLADE SURG 10 STRL SS (BLADE) ×2 IMPLANT
BLADE SURG 15 STRL LF DISP TIS (BLADE) IMPLANT
BLADE SURG 15 STRL SS (BLADE) ×3
BLADE SURG ROTATE 9660 (MISCELLANEOUS) IMPLANT
CANISTER SUCTION 2500CC (MISCELLANEOUS) ×2 IMPLANT
CHLORAPREP W/TINT 26ML (MISCELLANEOUS) ×3 IMPLANT
COVER SURGICAL LIGHT HANDLE (MISCELLANEOUS) ×3 IMPLANT
DECANTER SPIKE VIAL GLASS SM (MISCELLANEOUS) ×5 IMPLANT
DRAPE LAPAROTOMY T 102X78X121 (DRAPES) ×3 IMPLANT
DRAPE UTILITY XL STRL (DRAPES) ×4 IMPLANT
DRSG TEGADERM 4X4.75 (GAUZE/BANDAGES/DRESSINGS) IMPLANT
ELECT CAUTERY BLADE 6.4 (BLADE) ×3 IMPLANT
ELECT REM PT RETURN 9FT ADLT (ELECTROSURGICAL) ×3
ELECTRODE REM PT RTRN 9FT ADLT (ELECTROSURGICAL) ×1 IMPLANT
GAUZE SPONGE 4X4 12PLY STRL (GAUZE/BANDAGES/DRESSINGS) IMPLANT
GLOVE BIOGEL M STRL SZ7.5 (GLOVE) ×3 IMPLANT
GLOVE BIOGEL PI IND STRL 8 (GLOVE) ×2 IMPLANT
GLOVE BIOGEL PI INDICATOR 8 (GLOVE) ×6
GLOVE ECLIPSE 7.0 STRL STRAW (GLOVE) ×4 IMPLANT
GOWN STRL REUS W/ TWL LRG LVL3 (GOWN DISPOSABLE) ×1 IMPLANT
GOWN STRL REUS W/ TWL XL LVL3 (GOWN DISPOSABLE) ×1 IMPLANT
GOWN STRL REUS W/TWL 2XL LVL3 (GOWN DISPOSABLE) ×2 IMPLANT
GOWN STRL REUS W/TWL LRG LVL3 (GOWN DISPOSABLE) ×3
GOWN STRL REUS W/TWL XL LVL3 (GOWN DISPOSABLE)
KIT BASIN OR (CUSTOM PROCEDURE TRAY) ×3 IMPLANT
KIT ROOM TURNOVER OR (KITS) ×3 IMPLANT
LIQUID BAND (GAUZE/BANDAGES/DRESSINGS) ×2 IMPLANT
NDL HYPO 25GX1X1/2 BEV (NEEDLE) ×1 IMPLANT
NEEDLE HYPO 25GX1X1/2 BEV (NEEDLE) ×3 IMPLANT
NS IRRIG 1000ML POUR BTL (IV SOLUTION) ×3 IMPLANT
PACK SURGICAL SETUP 50X90 (CUSTOM PROCEDURE TRAY) ×3 IMPLANT
PAD ARMBOARD 7.5X6 YLW CONV (MISCELLANEOUS) ×4 IMPLANT
PENCIL BUTTON HOLSTER BLD 10FT (ELECTRODE) ×3 IMPLANT
SPECIMEN JAR SMALL (MISCELLANEOUS) IMPLANT
SPONGE GAUZE 4X4 12PLY STER LF (GAUZE/BANDAGES/DRESSINGS) ×2 IMPLANT
SPONGE LAP 18X18 X RAY DECT (DISPOSABLE) ×3 IMPLANT
STAPLER VISISTAT 35W (STAPLE) IMPLANT
SUT ETHILON 3 0 FSL (SUTURE) ×2 IMPLANT
SUT ETHILON 3 0 PS 1 (SUTURE) ×4 IMPLANT
SUT MNCRL AB 4-0 PS2 18 (SUTURE) ×2 IMPLANT
SUT VIC AB 3-0 SH 18 (SUTURE) ×2 IMPLANT
SUT VIC AB 3-0 SH 27 (SUTURE) ×6
SUT VIC AB 3-0 SH 27X BRD (SUTURE) ×1 IMPLANT
SYR BULB 3OZ (MISCELLANEOUS) ×2 IMPLANT
SYR CONTROL 10ML LL (SYRINGE) ×3 IMPLANT
TAPE CLOTH SURG 6X10 WHT LF (GAUZE/BANDAGES/DRESSINGS) ×2 IMPLANT
TOWEL OR 17X24 6PK STRL BLUE (TOWEL DISPOSABLE) ×3 IMPLANT
TOWEL OR 17X26 10 PK STRL BLUE (TOWEL DISPOSABLE) ×3 IMPLANT
TUBE CONNECTING 12'X1/4 (SUCTIONS) ×1
TUBE CONNECTING 12X1/4 (SUCTIONS) ×1 IMPLANT
WATER STERILE IRR 1000ML POUR (IV SOLUTION) IMPLANT
YANKAUER SUCT BULB TIP NO VENT (SUCTIONS) ×2 IMPLANT

## 2016-01-03 NOTE — Transfer of Care (Signed)
Immediate Anesthesia Transfer of Care Note  Patient: Aaron Patel.  Procedure(s) Performed: Procedure(s): EXCISION LOWER BACK LIPOMA (N/A)  Patient Location: PACU  Anesthesia Type:General  Level of Consciousness: awake, alert , oriented and patient cooperative  Airway & Oxygen Therapy: Patient Spontanous Breathing and Patient connected to nasal cannula oxygen  Post-op Assessment: Report given to RN, Post -op Vital signs reviewed and stable and Patient moving all extremities X 4  Post vital signs: Reviewed and stable  Last Vitals:  Vitals:   01/03/16 1021  BP: 137/83  Pulse: 74  Resp: 20  Temp: 37 C    Last Pain:  Vitals:   01/03/16 1021  TempSrc: Oral         Complications: No apparent anesthesia complications

## 2016-01-03 NOTE — Brief Op Note (Signed)
01/03/2016  2:29 PM  PATIENT:  Aaron Patel.  50 y.o. male  PRE-OPERATIVE DIAGNOSIS:  Lower back lipoma   POST-OPERATIVE DIAGNOSIS:  Lower back lipoma 9X6 CM  PROCEDURE:  Procedure(s): EXCISION LOWER BACK LIPOMA (N/A)  SURGEON:  Surgeon(s) and Role:    * Greer Pickerel, MD - Primary  PHYSICIAN ASSISTANT:   ASSISTANTS: none   ANESTHESIA:   general  EBL:  Total I/O In: 1600 [I.V.:1600] Out: 15 [Blood:15]  BLOOD ADMINISTERED:none  DRAINS: none   LOCAL MEDICATIONS USED:  MARCAINE WITH EPI  SPECIMEN:  Source of Specimen:  LIPOMA  DISPOSITION OF SPECIMEN:  PATHOLOGY  COUNTS:  YES  TOURNIQUET:  * No tourniquets in log *  DICTATION: .Other Dictation: Dictation Number [id number]  PLAN OF CARE: Discharge to home after PACU  PATIENT DISPOSITION:  PACU - hemodynamically stable.   Delay start of Pharmacological VTE agent (>24hrs) due to surgical blood loss or risk of bleeding: not applicable  Aaron Patel. Redmond Pulling, MD, FACS General, Bariatric, & Minimally Invasive Surgery [facility], [place]

## 2016-01-03 NOTE — H&P (View-Only) (Signed)
Aaron Patel 12/05/2015 8:42 AM Location: [facility] Patient #: [id number] DOB: 1965-07-29 Married / Language: English / Race: Black or African American Male   History of Present Illness Aaron Hiss M. Wilson MD; 12/05/2015 8:57 AM) Patient words: Recheck lipoma on back.  The patient is a 50 year old male who presents with a soft tissue mass. He comes back in to discuss his lower back lipoma. I initially met him in 2013 and again in June 2016. He states that he is now ready to proceed with surgery for his lipoma. He believes has gotten a little bit larger since last summer. He also complains that it may be causing a little bit more soreness in his back. However there is no radiculopathy. He denies any lymphadenopathy, night sweats, fever, chills, weight loss. He denies any other new soft tissue masses. He denies any significant medical changes since his last visit 1 year ago. He denies any blood thinners. He does not smoke.  11/2014 He is referred by Dr Redmond School for evaluation of a lipoma on his lower back. I initially met him in 2013. He was unable to schedule surgery at that time. He states that nothing is changed about it. He states that it might be a little bit larger. He states he is now starting to have some musculoskeletal discomfort in his left lower back. He is unsure if it's related to the lipoma. He denies any radiculopathy. He denies any weight change. He denies any fevers or chills. He denies any redness or drainage or over the area. He denies any swollen glands. He denies any chest pain, chest pressure, shortness of breath, TIAs or amaurosis fugax. He denies smoking.   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 Medications Reconciled  Social History Aaron Curry, MD; 12/05/2015 8:57 AM) Caffeine use Tea. Alcohol use Occasional alcohol use. Tobacco use Never smoker. No drug use  Family History Aaron Curry, MD; 12/05/2015 8:57 AM) Heart disease in male family member before age 42 Heart Disease Father, Mother. Diabetes Mellitus Father.    Review of Systems Aaron Hiss M. Wilson MD; 12/05/2015 8:55 AM) General Not Present- Appetite Loss, Chills, Fatigue, Fever, Night Sweats, Weight Gain and Weight Loss. Skin Not Present- Change in Wart/Mole, Dryness, Hives, Jaundice, New Lesions, Non-Healing Wounds, Rash and Ulcer. HEENT Present- Wears glasses/contact lenses. Not Present- Earache, Hearing Loss, Hoarseness, Nose Bleed, Oral Ulcers, Ringing in the Ears, Seasonal Allergies, Sinus Pain, Sore Throat, Visual Disturbances and Yellow Eyes. Respiratory Not Present- Bloody sputum, Chronic Cough, Difficulty Breathing, Snoring and Wheezing. Cardiovascular Not Present- Chest Pain, Difficulty Breathing Lying Down, Leg Cramps, Palpitations, Rapid Heart Rate, Shortness of Breath and Swelling of Extremities. Gastrointestinal Not Present- Abdominal Pain, Bloating, Bloody Stool, Change in Bowel Habits, Chronic diarrhea, Constipation, Difficulty Swallowing, Excessive gas, Gets full quickly at meals, Hemorrhoids, Indigestion, Nausea, Rectal Pain and Vomiting. Male Genitourinary Not Present- Blood in Urine, Change in Urinary Stream, Frequency, Impotence, Nocturia, Painful Urination, Urgency and Urine Leakage. Musculoskeletal Present- Back Pain and  Muscle Pain. Not Present- Joint Pain,  Joint Stiffness, Muscle Weakness and Swelling of Extremities. Neurological Not Present- Decreased Memory, Fainting, Headaches, Numbness, Seizures, Tingling, Tremor, Trouble walking and Weakness. Psychiatric Not Present- Anxiety, Bipolar, Change in Sleep Pattern, Depression, Fearful and Frequent crying. Endocrine Not Present- Cold Intolerance, Excessive Hunger, Hair Changes, Heat Intolerance and New Diabetes.  Vitals Aaron Patel CMA; 12/05/2015 8:44 AM) 12/05/2015 8:44 AM Weight: 228.2 lb Height: 72in Body Surface Area: 2.25 m Body Mass Index: 30.95 kg/m  Temp.: 98.27F  Pulse: 73 (Regular)  BP: 122/88 (Sitting, Left Arm, Standard)       Physical Exam Aaron Hiss M. Wilson MD; 12/05/2015 8:55 AM) General Mental Status-Alert. General Appearance-Consistent with stated age. Hydration-Well hydrated. Voice-Normal.  Integumentary Note: mid lower back (lumbar) well circumscribed mobile soft tissue mass 9 x 9 cm, nontender, no overlying skin lesion, no surrounding LAD   Head and Neck Head-normocephalic, atraumatic with no lesions or palpable masses. Trachea-midline. Thyroid Gland Characteristics - normal size and consistency.  Eye Eyeball - Bilateral-Extraocular movements intact. Sclera/Conjunctiva - Bilateral-No scleral icterus.  Chest and Lung Exam Chest and lung exam reveals -quiet, even and easy respiratory effort with no use of accessory muscles and on auscultation, normal breath sounds, no adventitious sounds and normal vocal resonance. Inspection Chest Wall - Normal. Back - normal.  Breast - Did not examine.  Cardiovascular Cardiovascular examination reveals -normal heart sounds, regular rate and rhythm with no murmurs and normal pedal pulses bilaterally.  Abdomen Inspection Inspection of the abdomen reveals - No Hernias. Skin - Scar - no surgical scars. Palpation/Percussion Palpation and Percussion of the abdomen reveal - Soft, Non  Tender, No Rebound tenderness, No Rigidity (guarding) and No hepatosplenomegaly. Auscultation Auscultation of the abdomen reveals - Bowel sounds normal.  Peripheral Vascular Upper Extremity Palpation - Pulses bilaterally normal.  Neurologic Neurologic evaluation reveals -alert and oriented x 3 with no impairment of recent or remote memory. Mental Status-Normal.  Neuropsychiatric The patient's mood and affect are described as -normal. Judgment and Insight-insight is appropriate concerning matters relevant to self.  Musculoskeletal Normal Exam - Left-Upper Extremity Strength Normal and Lower Extremity Strength Normal. Normal Exam - Right-Upper Extremity Strength Normal and Lower Extremity Strength Normal.  Lymphatic Head & Neck  General Head & Neck Lymphatics: Bilateral - Description - Normal. Axillary -Note: no LAD.  Femoral & Inguinal - Did not examine.    Assessment & Plan Aaron Hiss M. 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 The patient was given educational material. We discussed that the majority of lipomas are benign although on a rare occasion it can be malignant.  We discussed observation versus surgical excision. We discussed the risks and benefits of surgery including but not limited to bleeding, infection, injury to surrounding structures, scarring, seroma/hematoma, cosmetic concerns, possible temporary drain placement, blood clot formation, anesthesia issues, possible recurrence, and the typical postoperative course.  The patient has elected to proceed with EXCISION OF LOWER BACK LIPOMA Current Plans You are being scheduled for surgery - Our schedulers will call you.  You should hear from our office's scheduling department within 5 working days about the location, date, and time of surgery. We try to make accommodations for patient's preferences in scheduling surgery, but  sometimes the OR schedule or the surgeon's schedule prevents [place] from making those accommodations.  If you have not heard from our office [phone]) in 5 working days, call the office and ask for your  surgeon's nurse.  If you have other questions about your diagnosis, plan, or surgery, call the office and ask for your surgeon's nurse.  Pt Education - [facility] Education - Written Instructions given: discussed with patient and provided information.  Leighton Ruff. Redmond Pulling, MD, FACS General, Bariatric, & Minimally Invasive Surgery [facility], [place]

## 2016-01-03 NOTE — Discharge Instructions (Signed)
[place] Office Phone Number [phone]   POST OP INSTRUCTIONS  Always review your discharge instruction sheet given to you by the facility where your surgery was performed.  IF YOU HAVE DISABILITY OR FAMILY LEAVE FORMS, YOU MUST BRING THEM TO THE OFFICE FOR PROCESSING.  DO NOT GIVE THEM TO YOUR DOCTOR.  1. A prescription for pain medication may be given to you upon discharge.  Take your pain medication as prescribed, if needed.  If narcotic pain medicine is not needed, then you may take acetaminophen (Tylenol) or ibuprofen (Advil) as needed. 2. Take your usually prescribed medications unless otherwise directed 3. If you need a refill on your pain medication, please contact your pharmacy.  They will contact our office to request authorization.  Prescriptions will not be filled after 5pm or on week-ends. 4. You should eat very light the first 24 hours after surgery, such as soup, crackers, pudding, etc.  Resume your normal diet the day after surgery. 5. Most patients will experience some swelling and bruising in the incision. Ice packs will help.  Swelling and bruising can take several days to resolve.  6. It is common to experience some constipation if taking pain medication after surgery.  Increasing fluid intake and taking a stool softener will usually help or prevent this problem from occurring.  A mild laxative (Milk of Magnesia or Miralax) should be taken according to package directions if there are no bowel movements after 48 hours. 7. Unless discharge instructions indicate otherwise, you may remove your bandages 48 hours after surgery, and you may shower at that time.   Any sutures or staples will be removed at the office during your follow-up visit. Cover wound with dry gauze - change daily 8. ACTIVITIES:  You may resume regular daily activities (gradually increasing) beginning the next day.  Wearing a good support bra or sports bra minimizes pain and swelling.  You may  have sexual intercourse when it is comfortable. a. You may drive when you no longer are taking prescription pain medication, you can comfortably wear a seatbelt, and you can safely maneuver your car and apply brakes. b. RETURN TO WORK:   OTHER INSTRUCTIONS: DO NOT LIFT, PUSH, OR PULL ANYTHING GREATER THAN 15 POUNDS WHEN TO CALL YOUR DOCTOR: 1. Fever over 101.0 2. Nausea and/or vomiting. 3. Extreme swelling or bruising. 4. Continued bleeding from incision. 5. Increased pain, redness, or drainage from the incision.  The clinic staff is available to answer your questions during regular business hours.  Please dont hesitate to call and ask to speak to one of the nurses for clinical concerns.  If you have a medical emergency, go to the nearest emergency room or call 911.  A surgeon from [facility] is always on call at the hospital.  For further questions, please visit [email or web address]

## 2016-01-03 NOTE — Interval H&P Note (Signed)
History and Physical Interval Note:  01/03/2016 12:38 PM  Aaron Patel.  has presented today for surgery, with the diagnosis of Lower back lipoma   The various methods of treatment have been discussed with the patient and family. After consideration of risks, benefits and other options for treatment, the patient has consented to  Procedure(s): EXCISION LOWER BACK LIPOMA (N/A) as a surgical intervention .  The patient's history has been reviewed, patient examined, no change in status, stable for surgery.  I have reviewed the patient's chart and labs.  Questions were answered to the patient's satisfaction.    Aaron Patel. Redmond Pulling, MD, [place], Bariatric, & Minimally Invasive Surgery [facility], [place]   [facility] M

## 2016-01-03 NOTE — Anesthesia Preprocedure Evaluation (Addendum)
Anesthesia Evaluation  Patient identified by MRN, date of birth, ID band Patient awake    Reviewed: Allergy & Precautions, NPO status , Patient's Chart, lab work & pertinent test results  Airway Mallampati: I  TM Distance: >3 FB Neck ROM: Full    Dental  (+) Teeth Intact   Pulmonary former smoker,    breath sounds clear to auscultation       Cardiovascular + angina  Rhythm:Regular Rate:Normal  Cath 12/31/10 no significant CAD, EF 60%   Neuro/Psych  Headaches,    GI/Hepatic Neg liver ROS, GERD  Controlled,  Endo/Other  negative endocrine ROS  Renal/GU negative Renal ROS     Musculoskeletal negative musculoskeletal ROS (+)   Abdominal (+) + obese,   Peds  Hematology   Anesthesia Other Findings   Reproductive/Obstetrics                            BP Readings from Last 3 Encounters:  12/27/15 (!) 136/91  12/24/15 116/86  10/17/15 128/82   Lab Results  Component Value Date   WBC 6.0 12/27/2015   HGB 14.5 12/27/2015   HCT 41.4 12/27/2015   MCV 87.3 12/27/2015   PLT 285 12/27/2015     Chemistry      Component Value Date/Time   NA 138 12/24/2015 1111   K 4.4 12/24/2015 1111   CL 102 12/24/2015 1111   CO2 26 12/24/2015 1111   BUN 15 12/24/2015 1111   CREATININE 0.98 12/24/2015 1111      Component Value Date/Time   CALCIUM 9.0 12/24/2015 1111   ALKPHOS 89 12/24/2015 1111   AST 20 12/24/2015 1111   ALT 19 12/24/2015 1111   BILITOT 0.7 12/24/2015 1111     Lab Results  Component Value Date   INR 0.99 11/09/2014   No results found for: HGBA1C   05/10/13 Exercise Tolerance Test: Normal GXT with the patient exercising to 98% APMHR and 12.1 mets without chest pain or ischemic ECG changes. Confirmed by Claiborne Billings MD, [place]  Anesthesia Physical Anesthesia Plan  ASA: II  Anesthesia Plan: General   Post-op Pain Management:    Induction:   Airway Management Planned: Oral  ETT  Additional Equipment:   Intra-op Plan:   Post-operative Plan: Extubation in OR  Informed Consent: I have reviewed the patients History and Physical, chart, labs and discussed the procedure including the risks, benefits and alternatives for the proposed anesthesia with the patient or authorized representative who has indicated his/her understanding and acceptance.   Dental advisory given  Plan Discussed with: CRNA  Anesthesia Plan Comments:         Anesthesia Quick Evaluation

## 2016-01-03 NOTE — Anesthesia Procedure Notes (Signed)
Procedure Name: Intubation Date/Time: 01/03/2016 12:58 PM Performed by: Mervyn Gay Pre-anesthesia Checklist: Patient identified, Patient being monitored, Timeout performed, Emergency Drugs available and Suction available Patient Re-evaluated:Patient Re-evaluated prior to inductionOxygen Delivery Method: Circle System Utilized Preoxygenation: Pre-oxygenation with 100% oxygen Intubation Type: IV induction Ventilation: Mask ventilation without difficulty Laryngoscope Size: Miller and 3 Grade View: Grade I Tube type: Oral Tube size: 8.0 mm Number of attempts: 1 Airway Equipment and Method: Stylet Placement Confirmation: ETT inserted through vocal cords under direct vision,  positive ETCO2 and breath sounds checked- equal and bilateral Secured at: 23 cm Tube secured with: Tape Dental Injury: Teeth and Oropharynx as per pre-operative assessment

## 2016-01-04 NOTE — Op Note (Signed)
Aaron Patel, Aaron Patel              ACCOUNT NO.:  [id number]  MEDICAL RECORD NO.:  [id number]  LOCATION:  [facility]                         FACILITY:  [facility]  PHYSICIAN:  Leighton Ruff. Redmond Pulling, MD, FACSDATE OF BIRTH:  [birth year]  DATE OF PROCEDURE:  01/03/2016 DATE OF DISCHARGE:  01/03/2016                              OPERATIVE REPORT   PREOPERATIVE DIAGNOSIS:  Lower back lipoma.  POSTOPERATIVE DIAGNOSIS:  Lower back lipoma, 9 x 6 cm.  PROCEDURE:  Excision of lower back lipoma, 9 cm x 6 cm.  SURGEON:  Leighton Ruff. Redmond Pulling, MD, FACS.  ANESTHESIA:  General.  EBL:  Minimal.  INDICATIONS FOR PROCEDURE:  The patient has a chronic history of this lower back lipoma which has been slowly enlarging in size.  He desires surgical excision.  Please see my office notes for further details regarding discussion of the risks and benefits of the procedure.  DESCRIPTION OF PROCEDURE:  The patient was taken the OR 2 at [place] endotracheal anesthesia was established.  He was then placed in the prone position on the OR table.  Sequential compression devices were placed.  His back and lower back were prepped and draped in the usual standard surgical fashion with ChloraPrep.  A surgical time- out was performed.  He received IV antibiotics prior to skin incision. The mass was directly over the midline of the spine in the lower lumbar region, it grossly measured 9 x 9 cm.  A vertical planned incision was made with a marking pen.  I then infiltrated the skin and deep dermis with local.  I then made a 6 cm skin incision with a 15 blade.  The deep dermis was divided with electrocautery.  The subcutaneous tissue was divided.  I got down to the capsule encasing the lipoma.  Some of the avascular attachments were taken down bluntly, and then there were a few wispy attachments which were taken down with electrocautery.  The mass was circumferentially mobilized from the surrounding cavity.  It  was excised in its entirety.  The cavity was irrigated and hemostasis was achieved.  Additional local was placed in the overlying fascia and subcutaneous tissue.  The deep dermis was reapproximated with inverted 3- 0 Vicryl sutures.  The skin was reapproximated with 9 interrupted 3-0 nylon sutures followed by application of antibiotic ointment, gauze, and tape.  The patient was then placed in supine position.  Extubated and brought to the recovery room.  All needle, instrument, and sponge counts were correct x2.  There were no immediate complications.  The patient tolerated the procedure well.     Leighton Ruff. Redmond Pulling, MD, FACS     EMW/MEDQ  D:  01/03/2016  T:  01/04/2016  Job:  [id number]

## 2016-01-06 ENCOUNTER — Encounter (HOSPITAL_COMMUNITY): Payer: Self-pay | Admitting: General Surgery

## 2016-01-09 NOTE — Anesthesia Postprocedure Evaluation (Signed)
Anesthesia Post Note  Patient: Wester Terrel.  Procedure(s) Performed: Procedure(s) (LRB): EXCISION LOWER BACK LIPOMA (N/A)  Patient location during evaluation: PACU Anesthesia Type: General Level of consciousness: awake and alert Pain management: pain level controlled Vital Signs Assessment: post-procedure vital signs reviewed and stable Respiratory status: spontaneous breathing, nonlabored ventilation, respiratory function stable and patient connected to nasal cannula oxygen Cardiovascular status: blood pressure returned to baseline and stable Postop Assessment: no signs of nausea or vomiting Anesthetic complications: no    Last Vitals:  Vitals:   01/03/16 1428 01/03/16 1443  BP: 121/83 130/87  Pulse: 69 62  Resp: 10 14  Temp:      Last Pain:  Vitals:   01/03/16 1021  TempSrc: Oral                 Bronsen Serano,JAMES TERRILL

## 2016-03-02 ENCOUNTER — Ambulatory Visit (HOSPITAL_COMMUNITY)
Admission: EM | Admit: 2016-03-02 | Discharge: 2016-03-02 | Disposition: A | Discharge status: Home / Self Care | Payer: BC Managed Care – PPO | Attending: Family Medicine | Admitting: Family Medicine

## 2016-03-02 ENCOUNTER — Encounter (HOSPITAL_COMMUNITY): Payer: Self-pay | Admitting: Family Medicine

## 2016-03-02 ENCOUNTER — Ambulatory Visit (INDEPENDENT_AMBULATORY_CARE_PROVIDER_SITE_OTHER): Payer: BC Managed Care – PPO

## 2016-03-02 DIAGNOSIS — S62101A Fracture of unspecified carpal bone, right wrist, initial encounter for closed fracture: Secondary | ICD-10-CM

## 2016-03-02 NOTE — ED Provider Notes (Signed)
[place]    CSN: [id number] Arrival date & time: 03/02/16  1013  First Provider Contact:  First MD Initiated Contact with Patient 03/02/16 1133        History   Chief Complaint Chief Complaint  Patient presents with  . Wrist Pain    HPI Aaron Patel. is a 50 y.o. male.   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 He was wrestling with his son 3-1/2 weeks ago and fell back on his outstretched hand injuring the ulnar side of his right wrist. He's had continued pain since, provoked by vigorous handshake or trying to twist the wrist.  Patient's had no other injuries. He is right-handed.      Past Medical History:  Diagnosis Date  . Chest pain    cath 12/31/10: post AV CFX < 10% (no significant CAD), EF 60%   . Family history of ischemic heart disease   . GERD (gastroesophageal reflux disease)    intermittent  . Headache(784.0)   . HLD (hyperlipidemia)   . Hx of echocardiogram Aug 2012   Normal  . Lipoma    Dr. Greer Pickerel, consult 2013, pending surgery  . Obesity   . Palpitations    recheck with cardiology 02/2012.  Marland Kitchen Peyronie disease    Urology consult prior    Patient Active Problem List   Diagnosis Date Noted  . Family history of heart disease in male family member before age 45 12/24/2015  . Gastroesophageal reflux disease without esophagitis 12/24/2015  . Obesity 11/09/2014  . Lipoma of back 11/25/2011  . Peyronie's disease 05/04/2011  . HLD (hyperlipidemia)   . Chest pain     Past Surgical History:  Procedure Laterality Date  . CARDIAC CATHETERIZATION  12/2010   Dr. Marigene Ehlers   . LIPOMA EXCISION N/A 01/03/2016   Procedure: EXCISION LOWER BACK LIPOMA;  Surgeon: Greer Pickerel, MD;  Location: [facility];  Service: General;  Laterality: N/A;  . stye surgery         Home Medications    Prior to Admission medications   Medication Sig Start Date End Date Taking? Authorizing Provider  lovastatin (MEVACOR) 40 MG tablet Take  1 tablet (40 mg total) by mouth at bedtime. 12/24/15  Yes [name], MD  Multiple Vitamin (MULTIVITAMIN WITH MINERALS) TABS tablet Take 1 tablet by mouth daily.    Historical Provider, MD  oxyCODONE (OXY IR/ROXICODONE) 5 MG immediate release tablet Take 1-2 tablets (5-10 mg total) by mouth every 6 (six) hours as needed for moderate pain, severe pain or breakthrough pain. 01/03/16   Greer Pickerel, MD    Family History Family History  Problem Relation Age of Onset  . Asthma Mother   . Mental illness Mother   . Heart disease Mother     died of CHF  . Arthritis Father   . Diabetes Father   . Heart disease Father     heart transplant, died of heart disease 5 years later  . Hypertension Brother   . Arthritis Paternal Aunt   . Diabetes Paternal Aunt   . Arthritis Paternal Uncle   . Diabetes Paternal Uncle   . Clotting disorder Maternal Aunt   . Cancer Maternal Grandmother   . Stroke Neg Hx     Social History Social History  Substance Use Topics  . Smoking status: Former Smoker    Packs/day: 0.00    Years: 0.50    Quit date: 06/08/2006  . Smokeless tobacco: Never  Used     Comment: recreational smoking, not every day, quit in 2008  . Alcohol use 0.6 oz/week    1 Shots of liquor per week     Comment: occasionally     Allergies   Iodinated diagnostic agents   Review of Systems Review of Systems  Constitutional: Negative.   HENT: Negative.   Respiratory: Negative.   Cardiovascular: Negative.      Physical Exam Triage Vital Signs ED Triage Vitals [03/02/16 1121]  Enc Vitals Group     BP 134/94     Pulse Rate 75     Resp 12     Temp 99.5 F (37.5 C)     Temp Source Oral     SpO2 95 %     Weight      Height      Head Circumference      Peak Flow      Pain Score      Pain Loc      Pain Edu?      Excl. in [place]?    No data found.   Updated Vital Signs BP 134/94 (BP Location: Left Arm)   Pulse 75   Temp 99.5 F (37.5 C) (Oral)   Resp 12   SpO2 95%       Physical Exam  Constitutional: He appears well-developed and well-nourished.  HENT:  Head: Normocephalic.  Eyes: Conjunctivae are normal. Pupils are equal, round, and reactive to light.  Neck: Normal range of motion. Neck supple.  Musculoskeletal:  Patient has minimal swelling just distal to the right ulnar collateral ligament. He has full range of motion of his wrist and no bony abnormality is seen. There is no ecchymosis.  Nursing note and vitals reviewed.    UC Treatments / Results  Labs (all labs ordered are listed, but only abnormal results are displayed) Labs Reviewed - No data to display  EKG  EKG Interpretation None       Radiology Dg Wrist Complete Right  Result Date: 03/02/2016 CLINICAL DATA:  Right wrist pain for 3 weeks after playing with his son EXAM: RIGHT WRIST - COMPLETE 3+ VIEW COMPARISON:  None. FINDINGS: Three views of the right wrist submitted. There is tiny avulsion fracture at the tip of ulnar-styloid of indeterminate age. Subtle mild widening of the scapular lunate joint space. Ligamental injury cannot be excluded. Tiny calcifications are noted at this level suspicious for chondrocalcinosis. Further correlation with MRI could be performed as clinically warranted. IMPRESSION: There is tiny avulsion fracture at the tip of ulnar-styloid of indeterminate age. Subtle mild widening of the scapular lunate joint space. Ligamental injury cannot be excluded. Tiny calcifications are noted at this level suspicious for chondrocalcinosis. Further correlation with MRI could be performed as clinically warranted. Electronically Signed   By: Lahoma Crocker M.D.   On: 03/02/2016 11:53   CLINICAL DATA:  Right wrist pain for 3 weeks after playing with his son  EXAM: RIGHT WRIST - COMPLETE 3+ VIEW  COMPARISON:  None.  FINDINGS: Three views of the right wrist submitted. There is tiny avulsion fracture at the tip of ulnar-styloid of indeterminate age. Subtle mild widening of  the scapular lunate joint space. Ligamental injury cannot be excluded. Tiny calcifications are noted at this level suspicious for chondrocalcinosis. Further correlation with MRI could be performed as clinically warranted.  IMPRESSION: There is tiny avulsion fracture at the tip of ulnar-styloid of indeterminate age. Subtle mild widening of the scapular lunate joint space. Ligamental  injury cannot be excluded. Tiny calcifications are noted at this level suspicious for chondrocalcinosis. Further correlation with MRI could be performed as clinically warranted.   Electronically Signed   By: Lahoma Crocker M.D.   On: 03/02/2016 11:53  Procedures Procedures (including critical care time)  Medications Ordered in UC Medications - No data to display   Initial Impression / Assessment and Plan / UC Course  I have reviewed the triage vital signs and the nursing notes.  Pertinent labs & imaging results that were available during my care of the patient were reviewed by me and considered in my medical decision making (see chart for details).  Clinical Course    Final Clinical Impressions(s) / UC Diagnoses   Final diagnoses:  Right wrist fracture, closed, initial encounter    New Prescriptions New Prescriptions   No medications on file  Follow-up in one week with primary care doctor. Splint in the meantime.   Robyn Haber, MD 03/02/16 1209

## 2016-03-02 NOTE — ED Triage Notes (Signed)
Patient reports rough-housing with son.  Patient planted right hand, palm down on floor.  Possibly hyperextended right wrist.  Patient reports incident 3 1/2 weeks ago.  Radial pulse is 2 +.  Patient has pain in ulnar aspect of wrist with specific movement of wrist.

## 2016-05-25 ENCOUNTER — Other Ambulatory Visit: Payer: Self-pay | Admitting: Occupational Medicine

## 2016-05-25 ENCOUNTER — Ambulatory Visit: Payer: Self-pay

## 2016-05-25 DIAGNOSIS — M25522 Pain in left elbow: Secondary | ICD-10-CM

## 2016-11-06 DIAGNOSIS — Z9289 Personal history of other medical treatment: Secondary | ICD-10-CM

## 2016-11-06 HISTORY — DX: Personal history of other medical treatment: Z92.89

## 2016-12-02 ENCOUNTER — Ambulatory Visit (INDEPENDENT_AMBULATORY_CARE_PROVIDER_SITE_OTHER): Payer: BC Managed Care – PPO | Admitting: Nurse Practitioner

## 2016-12-02 ENCOUNTER — Encounter: Payer: Self-pay | Admitting: Nurse Practitioner

## 2016-12-02 VITALS — BP 110/80 | HR 59 | Ht 72.0 in | Wt 231.8 lb

## 2016-12-02 DIAGNOSIS — R002 Palpitations: Secondary | ICD-10-CM | POA: Diagnosis not present

## 2016-12-02 DIAGNOSIS — E7800 Pure hypercholesterolemia, unspecified: Secondary | ICD-10-CM

## 2016-12-02 LAB — BASIC METABOLIC PANEL
BUN/Creatinine Ratio: 13 (ref 9–20)
BUN: 13 mg/dL (ref 6–24)
CO2: 24 mmol/L (ref 20–29)
Calcium: 9.3 mg/dL (ref 8.7–10.2)
Chloride: 101 mmol/L (ref 96–106)
Creatinine, Ser: 1.04 mg/dL (ref 0.76–1.27)
GFR calc Af Amer: 97 mL/min/{1.73_m2} (ref >59)
GFR calc non Af Amer: 84 mL/min/{1.73_m2} (ref >59)
Glucose: 80 mg/dL (ref 65–99)
Potassium: 4.4 mmol/L (ref 3.5–5.2)
Sodium: 139 mmol/L (ref 134–144)

## 2016-12-02 LAB — LIPID PANEL
Chol/HDL Ratio: 3.4 ratio (ref 0.0–5.0)
Cholesterol, Total: 173 mg/dL (ref 100–199)
HDL: 51 mg/dL (ref >39)
LDL Calculated: 106 mg/dL — ABNORMAL HIGH (ref 0–99)
Triglycerides: 78 mg/dL (ref 0–149)
VLDL Cholesterol Cal: 16 mg/dL (ref 5–40)

## 2016-12-02 LAB — HEPATIC FUNCTION PANEL
ALT: 23 IU/L (ref 0–44)
AST: 20 IU/L (ref 0–40)
Albumin: 4.2 g/dL (ref 3.5–5.5)
Alkaline Phosphatase: 93 IU/L (ref 39–117)
Bilirubin Total: 0.4 mg/dL (ref 0.0–1.2)
Bilirubin, Direct: 0.13 mg/dL (ref 0.00–0.40)
Total Protein: 6.6 g/dL (ref 6.0–8.5)

## 2016-12-02 NOTE — Patient Instructions (Addendum)
We will be checking the following labs today - BMET, HPF and lipids   Medication Instructions:    Continue with your current medicines.     Testing/Procedures To Be Arranged:  N/A  Follow-Up:   See [place] back every 3 to 5 years to consider repeat stress testing - sooner if you have any complaints/issues    Other Special Instructions:   Stay active  We will see what your labs show and consider changing your statin if needed.     If you need a refill on your cardiac medications before your next appointment, please call your pharmacy.   Call the [place] office at [phone] if you have any questions, problems or concerns.

## 2016-12-02 NOTE — Progress Notes (Signed)
CARDIOLOGY OFFICE NOTE  Date:  12/02/2016    Aaron Patel. Date of Birth: Jan 15, 1966 Medical Record #[id number]  PCP:  [name], MD  Cardiologist:  Servando Snare    Chief Complaint  Patient presents with  . Palpitations    New patient visit - last seen by Dr. Aundra Dubin in 2014.     History of Present Illness: Aaron Patel. is a 51 y.o. male who presents today for a new patient visit. He has seen Dr. Aundra Dubin last in 2014.   He had been admitted to [facility] in 7/12 with chest pain.  He had one troponin that returned abnormal at 0.33.  This was concerning for a non-ST elevation myocardial infarction.  He was taken to the cardiac catheterization lab.  This demonstrated no significant CAD.  He had an EF of 60%. Echo was done in 8/12, showing EF 60-65%, normal study.  He was later seen by Richardson Dopp in the office, and given history of palpitations, was given an event monitor to assess for arrhythmias.  The monitor showed occasional PVCs, otherwise no concerning findings.  He had a sleep study done in 10/12 that did not show significant OSA .   Last seen back in 2014 - had had some atypical chest pain - non exertional - PPI was added and GXT was recommended - this was normal and he had good exercise tolerance noted.   Referred back here from Dr. [facility]mond [employer] for palpitations.   Comes in today. Here alone. Here really because of risk factors and wanting to be proactive for his health.  He continues to [employer] basketball. 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   He has had some numbness in his arms and hands - when he sleeps. He woke up one time about a month ago with an irregular heart beat - beating fast and hard. Short lived. About 10 days ago he had shortness of breath - 3 days prior he had been to basketball camp and it was pretty challenging - may have been dehydrated/got overheated - no real palpitations with this - just short of breath. He sat and rested and got ok. Resolved very  quickly - within minutes. He has otherwise done fine. No chest pain/discomfort. He is very active but admits it is more with seasonal activity. Some indigestion noted.  Sounds like both his parents had had CHF - father had had a heart transplant. Mom died at 18. Father died around 47 years of age. He is quite motivated towards health habits. Wanting cholesterol levels checked - has been on Mevacor for years.   PMH: 1. Hyperlipidemia 2. Chest pain: LHC (7/12) with no significant CAD and EF 60%.  Echo (7/12) with EF 60-65% normal study.  3. GERD 4. Palpitations: PVCs on prior event monitor.  5. Sleep study 10/12 with no OSA 6. PFTs (8/14) were essentially normal   Past Medical History:  Diagnosis Date  . Chest pain    cath 12/31/10: post AV CFX < 10% (no significant CAD), EF 60%   . Family history of ischemic heart disease   . GERD (gastroesophageal reflux disease)    intermittent  . Headache(784.0)   . HLD (hyperlipidemia)   . Hx of echocardiogram Aug 2012   Normal  . Lipoma    Dr. Greer Pickerel, consult 2013, pending surgery  . Obesity   . Palpitations    recheck with cardiology 02/2012.  Marland Kitchen Peyronie disease    Urology consult prior  Past Surgical History:  Procedure Laterality Date  . CARDIAC CATHETERIZATION  12/2010   Dr. Marigene Ehlers   . LIPOMA EXCISION N/A 01/03/2016   Procedure: EXCISION LOWER BACK LIPOMA;  Surgeon: Greer Pickerel, MD;  Location: [place];  Service: General;  Laterality: N/A;  . stye surgery       Medications: Current Meds  Medication Sig  . lovastatin (MEVACOR) 40 MG tablet Take 1 tablet (40 mg total) by mouth at bedtime.  . Multiple Vitamin (MULTIVITAMIN WITH MINERALS) TABS tablet Take 1 tablet by mouth daily.     Allergies: Allergies  Allergen Reactions  . Iodinated Diagnostic Agents Hives and Rash    Social History: The patient  reports that he quit smoking about 10 years ago. He smoked 0.00 packs per day for 0.50 years. He has never used smokeless  tobacco. He reports that he drinks about 0.6 oz of alcohol per week . He reports that he does not use drugs.   Family History: The patient's family history includes Arthritis in his father, paternal aunt, and paternal uncle; Asthma in his mother; Cancer in his maternal grandmother; Clotting disorder in his maternal aunt; Diabetes in his father, paternal aunt, and paternal uncle; Heart disease in his father and mother; Hypertension in his brother; Mental illness in his mother. Father died 5 years after having a heart transplant.   Review of Systems: Please see the history of present illness.   Otherwise, the review of systems is positive for none.   All other systems are reviewed and negative.   Physical Exam: VS:  BP 110/80 (BP Location: Left Arm, Patient Position: Sitting, Cuff Size: Large)   Pulse (!) 59   Ht 6' (1.829 m)   Wt 231 lb 12.8 oz (105.1 kg)   BMI 31.44 kg/m  .  BMI Body mass index is 31.44 kg/m.  Wt Readings from Last 3 Encounters:  12/02/16 231 lb 12.8 oz (105.1 kg)  01/03/16 226 lb (102.5 kg)  12/27/15 226 lb 3.2 oz (102.6 kg)    General: Pleasant. Well developed, well nourished and in no acute distress.   HEENT: Normal.  Neck: Supple, no JVD, carotid bruits, or masses noted.  Cardiac: Regular rate and rhythm. No murmurs, rubs, or gallops. No edema.  Respiratory:  Lungs are clear to auscultation bilaterally with normal work of breathing.  GI: Soft and nontender.  MS: No deformity or atrophy. Gait and ROM intact.  Skin: Warm and dry. Color is normal.  Neuro:  Strength and sensation are intact and no gross focal deficits noted.  Psych: Alert, appropriate and with normal affect.   LABORATORY DATA:  EKG:  EKG is ordered today. This demonstrates sinus bradycardia - rate of 59 and is normal.  Lab Results  Component Value Date   WBC 6.0 12/27/2015   HGB 14.5 12/27/2015   HCT 41.4 12/27/2015   PLT 285 12/27/2015   GLUCOSE 88 12/24/2015   CHOL 179 12/24/2015    TRIG 91 12/24/2015   HDL 56 12/24/2015   LDLCALC 105 12/24/2015   ALT 19 12/24/2015   AST 20 12/24/2015   NA 138 12/24/2015   K 4.4 12/24/2015   CL 102 12/24/2015   CREATININE 0.98 12/24/2015   BUN 15 12/24/2015   CO2 26 12/24/2015   TSH 0.99 12/03/2011   PSA 0.76 01/17/2013   INR 0.99 11/09/2014     BNP (last 3 results) No results for input(s): BNP in the last 8760 hours.  ProBNP (last 3 results) No  results for input(s): PROBNP in the last 8760 hours.   Other Studies Reviewed Today:  Echo Study Conclusions 2012  - Left ventricle: The cavity size was normal. Wall thickness was normal. Systolic function was normal. The estimated ejection fraction was in the range of 60% to 65%. Wall motion was normal; there were no regional wall motion abnormalities. Left ventricular diastolic function parameters were normal. - Aortic valve: There was no stenosis. - Mitral valve: Trivial regurgitation. - Right ventricle: The cavity size was normal. Systolic function was normal. - Pulmonary arteries: No complete TR doppler jet so unable to estimate PA systolic pressure. - Inferior vena cava: The vessel was normal in size; the respirophasic diameter changes were in the normal range (= 50%); findings are consistent with normal central venous pressure. Impressions:  - Normal study   Coronary angiography 2012: 1. Left main coronary artery:  The vessel is normal in size, but     short.  It is free of any significant disease. 2. Left circumflex:  The vessel is large in size and codominant.  It     has minor irregularities without any obstructive disease.  OM-1 is     small in size and free of significant disease.  OM-2 is medium size     and free of significant disease.  OM-3 is normal in size.  The     posterior AV groove is normal in size with mild proximal disease of     less than 10%.  It gives two posterolateral branches. 3. Left anterior descending artery:  The  vessel is normal in size with     minor irregularities, but no evidence of obstructive disease.  The     LAD itself does not reach the apex which is supplied by a large     third diagonal branch which actually wraps around the apex.  First     and second diagonals are normal in size and free of significant     disease. 4. Right coronary artery:  The vessel is normal in size and     codominant.  It is free of any significant disease.  It gives a     medium-sized PDA distally.  STUDY CONCLUSIONS: 1. No significant coronary artery disease. 2. Normal LV systolic function. 3. Chest pain is likely noncardiac.   Assessment/Plan:  1. Palpitations - rare - has had noted PVCs on prior monitor. Does not really seem to be bothersome - would monitor for now.   2. Shortness of breath - one episode - probably got overheated. Consider repeat echo if recurred/worsened. Will arrange GXT.   3. CV risk factors - he has +FH for CAD as well as HLD. He would like to be proactive with his health. Will update his GXT and continue with CV risk factor modification.   4. Prior cardiac cath with no significant CAD noted - continue with CV risk factor modification - arranging to update his GXT.   5. HLD - on statin - labs today - he is fasting - he would like to consider alternative therapy if indicated.   Current medicines are reviewed with the patient today.  The patient does not have concerns regarding medicines other than what has been noted above.  The following changes have been made:  See above.  Labs/ tests ordered today include:    Orders Placed This Encounter  Procedures  . Basic metabolic panel  . Hepatic function panel  . Lipid panel  . EXERCISE TOLERANCE  TEST  . EKG 12-Lead     Disposition:   FU prn unless his GXT is abnormal. I have advised that he come back every 3 to 5 years for check and stress testing given his history, his FH and other risk factors.    Patient is agreeable to  this plan and will call if any problems develop in the interim.   SignedTruitt Merle, NP  12/02/2016 9:04 AM  [facility] [address] [place], [place]  [address] Phone: [phone] Fax: [phone]

## 2016-12-03 ENCOUNTER — Ambulatory Visit (INDEPENDENT_AMBULATORY_CARE_PROVIDER_SITE_OTHER): Payer: BC Managed Care – PPO

## 2016-12-03 DIAGNOSIS — E7800 Pure hypercholesterolemia, unspecified: Secondary | ICD-10-CM | POA: Diagnosis not present

## 2016-12-03 DIAGNOSIS — R002 Palpitations: Secondary | ICD-10-CM

## 2016-12-03 LAB — EXERCISE TOLERANCE TEST
Estimated workload: 11.7 METS
Exercise duration (min): 10 min
Exercise duration (sec): 0 s
MPHR: 171 {beats}/min
Peak HR: 160 {beats}/min
Percent HR: 93 %
RPE: 15
Rest HR: 72 {beats}/min

## 2016-12-04 ENCOUNTER — Telehealth: Payer: Self-pay | Admitting: *Deleted

## 2016-12-04 NOTE — Telephone Encounter (Signed)
-----   Message from Burtis Junes, NP sent at 12/04/2016  9:15 AM EDT ----- Ok to report. His treadmill study is excellent. Needs to continue with CV risk factor modification.   Copy to his PCP please.

## 2016-12-04 NOTE — Telephone Encounter (Signed)
Pt notified of GXT results. Pt thanked me for the good news on GXT results. Pt does state though that Truitt Merle, NP had d/w him about possibly changing from Lovastatin over to Crestor. I advised pt I will send a message to Truitt Merle, NP in regards to Lovastatin or Crestor and her CMA Andee Poles will call him back on Monday. Pt thanked me for my call today.

## 2016-12-15 ENCOUNTER — Telehealth: Payer: Self-pay

## 2016-12-15 NOTE — Telephone Encounter (Signed)
Likewise, work them in

## 2016-12-15 NOTE — Telephone Encounter (Signed)
Pt called to inquire if you or shane could work in him and his son Aaron Patel for physicals before summer ends. Please advise.

## 2016-12-15 NOTE — Telephone Encounter (Signed)
See what you can do

## 2016-12-17 NOTE — Telephone Encounter (Signed)
Worked in per [employer]. Victorino December

## 2017-01-07 ENCOUNTER — Ambulatory Visit (INDEPENDENT_AMBULATORY_CARE_PROVIDER_SITE_OTHER): Payer: BC Managed Care – PPO | Admitting: Medical

## 2017-01-07 ENCOUNTER — Encounter: Payer: Self-pay | Admitting: Medical

## 2017-01-07 VITALS — BP 124/72 | HR 72 | Ht 71.0 in | Wt 231.0 lb

## 2017-01-07 DIAGNOSIS — N486 Induration penis plastica: Secondary | ICD-10-CM | POA: Diagnosis not present

## 2017-01-07 DIAGNOSIS — Z125 Encounter for screening for malignant neoplasm of prostate: Secondary | ICD-10-CM

## 2017-01-07 DIAGNOSIS — E785 Hyperlipidemia, unspecified: Secondary | ICD-10-CM

## 2017-01-07 DIAGNOSIS — Z8249 Family history of ischemic heart disease and other diseases of the circulatory system: Secondary | ICD-10-CM | POA: Diagnosis not present

## 2017-01-07 DIAGNOSIS — Z0000 Encounter for general adult medical examination without abnormal findings: Secondary | ICD-10-CM

## 2017-01-07 DIAGNOSIS — Z1211 Encounter for screening for malignant neoplasm of colon: Secondary | ICD-10-CM | POA: Diagnosis not present

## 2017-01-07 DIAGNOSIS — Z6832 Body mass index (BMI) 32.0-32.9, adult: Secondary | ICD-10-CM

## 2017-01-07 DIAGNOSIS — E669 Obesity, unspecified: Secondary | ICD-10-CM

## 2017-01-07 DIAGNOSIS — K219 Gastro-esophageal reflux disease without esophagitis: Secondary | ICD-10-CM

## 2017-01-07 LAB — BASIC METABOLIC PANEL
BUN: 14 mg/dL (ref 7–25)
CHLORIDE: 101 mmol/L (ref 98–110)
CO2: 25 mmol/L (ref 20–31)
Calcium: 9.1 mg/dL (ref 8.6–10.3)
Creat: 1.03 mg/dL (ref 0.70–1.33)
GLUCOSE: 78 mg/dL (ref 65–99)
POTASSIUM: 4.2 mmol/L (ref 3.5–5.3)
SODIUM: 139 mmol/L (ref 135–146)

## 2017-01-07 LAB — POCT URINALYSIS DIP (PROADVANTAGE DEVICE)
Bilirubin, UA: NEGATIVE
Blood, UA: NEGATIVE
GLUCOSE UA: NEGATIVE mg/dL
Ketones, POC UA: NEGATIVE mg/dL
LEUKOCYTES UA: NEGATIVE
NITRITE UA: NEGATIVE
PROTEIN UA: NEGATIVE mg/dL
SPECIFIC GRAVITY, URINE: 1.025
UUROB: NEGATIVE
pH, UA: 6 (ref 5.0–8.0)

## 2017-01-07 LAB — CBC
HCT: 46.3 % (ref 38.5–50.0)
HEMOGLOBIN: 15.7 g/dL (ref 13.2–17.1)
MCH: 30.6 pg (ref 27.0–33.0)
MCHC: 33.9 g/dL (ref 32.0–36.0)
MCV: 90.3 fL (ref 80.0–100.0)
MPV: 8.9 fL (ref 7.5–12.5)
Platelets: 308 10*3/uL (ref 140–400)
RBC: 5.13 MIL/uL (ref 4.20–5.80)
RDW: 14 % (ref 11.0–15.0)
WBC: 6.2 10*3/uL (ref 4.0–10.5)

## 2017-01-07 NOTE — Progress Notes (Signed)
Subjective:   HPI  Aaron Patel. is a 51 y.o. male who presents for Chief Complaint  Patient presents with  . Annual Exam    physical , discuss having colonscopy and tst level, discuss changing the his cholestrol medicine.   Medical team: NP Truitt Merle, cardiology Podiatry Sees eye doctor and dentist [name], MD and Dorothea Ogle, PA-C here  Concerns: Wants testosterone checked  Reviewed their medical, surgical, family, social, medication, and allergy history and updated chart as appropriate.  Past Medical History:  Diagnosis Date  . Chest pain    cath 12/31/10: post AV CFX < 10% (no significant CAD), EF 60%   . Family history of ischemic heart disease   . GERD (gastroesophageal reflux disease)    intermittent  . HLD (hyperlipidemia)   . Hx of echocardiogram Aug 2012   Normal  . Lipoma    Dr. Greer Pickerel, consult 2013, pending surgery  . Obesity   . Palpitations    recheck with cardiology 02/2012.  Marland Kitchen Peyronie disease    Urology consult prior    Past Surgical History:  Procedure Laterality Date  . CARDIAC CATHETERIZATION  12/2010   Dr. Marigene Ehlers   . LIPOMA EXCISION N/A 01/03/2016   Procedure: EXCISION LOWER BACK LIPOMA;  Surgeon: Greer Pickerel, MD;  Location: [facility];  Service: General;  Laterality: N/A;  . stye surgery      Social History   Social History  . Marital status: Married    Spouse name: N/A  . Number of children: N/A  . Years of education: N/A   Occupational History  . 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 Smoking status: Former Smoker    Packs/day: 0.00    Years: 0.50    Quit date: 06/08/2006  . Smokeless tobacco: Never Used     Comment: recreational smoking, not every day, quit in 2008  . Alcohol use 0.6 oz/week    1 Shots of liquor per week     Comment: occasionally  . Drug use: No  . Sexual activity: Yes   Other Topics Concern  . Not on file   Social  History Narrative   Basketball, coaches basketball, married, 2 children both teenagers.  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  Exercise - officiate basketball,running up and down the court.  [employer].  01/2017    Family History  Problem Relation Age of Onset  . Asthma Mother   . Mental illness Mother   . Heart disease Mother        died of CHF  . Arthritis Father   . Diabetes Father   . Heart disease Father        heart transplant, died of heart disease 5 years later  . Hypertension Brother   . Arthritis Paternal Aunt   . Diabetes Paternal Aunt   . Arthritis Paternal Uncle   . Diabetes Paternal Uncle   . Clotting disorder Maternal Aunt   . Cancer Maternal Grandmother   . Stroke Neg Hx      Current Outpatient Prescriptions:  .  lovastatin (MEVACOR) 40 MG tablet, Take 1 tablet (40 mg total) by mouth at bedtime., Disp: 90 tablet, Rfl: 3 .  Multiple Vitamin (MULTIVITAMIN WITH MINERALS) TABS tablet, Take 1 tablet by mouth daily., Disp: , Rfl:   Allergies  Allergen Reactions  . Iodinated Diagnostic Agents Hives and Rash       Review of  Systems Constitutional: -fever, -chills, -sweats, -unexpected weight change, -decreased appetite, -fatigue Allergy: -sneezing, -itching, -congestion Dermatology: -changing moles, --rash, -lumps ENT: -runny nose, -ear pain, -sore throat, -hoarseness, -sinus pain, -teeth pain, - ringing in ears, -hearing loss, -nosebleeds Cardiology: -chest pain, -palpitations, -swelling, -difficulty breathing when lying flat, -waking up short of breath Respiratory: -cough, -shortness of breath, -difficulty breathing with exercise or exertion, -wheezing, -coughing up blood Gastroenterology: -abdominal pain, -nausea, -vomiting, -diarrhea, -constipation, -blood in stool, -changes in bowel movement, -difficulty swallowing or eating Hematology: -bleeding, -bruising  Musculoskeletal: -joint aches, -muscle aches, -joint swelling, -back pain, -neck pain, -cramping, -changes in  gait Ophthalmology: denies vision changes, eye redness, itching, discharge Urology: -burning with urination, -difficulty urinating, -blood in urine, -urinary frequency, -urgency, -incontinence Neurology: -headache, -weakness, -tingling, -numbness, -memory loss, -falls, -dizziness Psychology: -depressed mood, -agitation, -sleep problems     Objective:   BP 124/72   Pulse 72   Ht 5\' 11"  (1.803 m)   Wt 231 lb (104.8 kg)   SpO2 97%   BMI 32.22 kg/m   General appearance: alert, no distress, WD/WN, African American male Skin: tattoo brand scar on left upper arm, chinese characters/words tattoo on right upper arm, few scattered benign lesions HEENT: normocephalic, conjunctiva/corneas normal, sclerae anicteric, PERRLA, EOMi, nares patent, no discharge or erythema, pharynx normal Oral cavity: MMM, tongue normal, teeth normal Neck: supple, no lymphadenopathy, no thyromegaly, no masses, normal ROM, no bruits Chest: non tender, normal shape and expansion Heart: RRR, normal S1, S2, no murmurs Lungs: CTA bilaterally, no wheezes, rhonchi, or rales Abdomen: +bs, soft, non tender, non distended, no masses, no hepatomegaly, no splenomegaly, no bruits Back: non tender, normal ROM, no scoliosis Musculoskeletal: upper extremities non tender, no obvious deformity, normal ROM throughout, lower extremities non tender, no obvious deformity, normal ROM throughout Extremities: no edema, no cyanosis, no clubbing Pulses: 2+ symmetric, upper and lower extremities, normal cap refill Neurological: alert, oriented x 3, CN2-12 intact, strength normal upper extremities and lower extremities, sensation normal throughout, DTRs 2+ throughout, no cerebellar signs, gait normal Psychiatric: normal affect, behavior normal, pleasant  GU: normal male external genitalia,circumcised, nontender, no masses, no hernia, no lymphadenopathy Rectal: anus normal tone, prostate WNL, no lesions   Assessment and Plan :    Encounter  Diagnoses  Name Primary?  . Routine general medical examination at a health care facility Yes  . Peyronie's disease   . Gastroesophageal reflux disease without esophagitis   . Hyperlipidemia, unspecified hyperlipidemia type   . Family history of heart disease in male family member before age 42   . Class 1 obesity with serious comorbidity and body mass index (BMI) of 32.0 to 32.9 in adult, unspecified obesity type   . Screen for colon cancer   . Screening for prostate cancer     Physical exam - discussed and counseled on healthy lifestyle, diet, exercise, preventative care, vaccinations, sick and well care, proper use of emergency dept and after hours care, and addressed their concerns.    Health screening: See your eye doctor yearly for routine vision care. See your dentist yearly for routine dental care including hygiene visits twice yearly.  Cancer screening Discussed colonoscopy screening, referral today Discussed PSA, prostate exam, and prostate cancer screening risks/benefits.   Discussed prostate symptoms as well.  Prostate screening performed: Yes  Vaccinations: Counseled on the following vaccines:  influenza Shingles  Acute issues discussed: none  Separate significant chronic issues discussed: peyronnes - f/u with urology prn hyperlipidemia - reviewed recent labs in  11/2016 and recent cardiology notes  Huckleberry was seen today for annual exam.  Diagnoses and all orders for this visit:  Routine general medical examination at a health care facility -     POCT Urinalysis DIP (Proadvantage Device) -     CBC -     Basic metabolic panel -     Hemoglobin A1c -     PSA -     Ambulatory referral to Gastroenterology -     Testosterone  Peyronie's disease  Gastroesophageal reflux disease without esophagitis  Hyperlipidemia, unspecified hyperlipidemia type  Family history of heart disease in male family member before age 60  Class 1 obesity with serious comorbidity  and body mass index (BMI) of 32.0 to 32.9 in adult, unspecified obesity type -     Hemoglobin A1c  Screen for colon cancer -     Ambulatory referral to Gastroenterology  Screening for prostate cancer -     PSA   Follow-up pending labs, yearly for physical

## 2017-01-07 NOTE — Patient Instructions (Signed)
We are referring you for your first Colonoscopy  I recommend you have a shingles vaccine to help prevent shingles or herpes zoster outbreak.   Please call your insurer to inquire about coverage for the Shingrix vaccine given in 2 doses.   Some insurers cover this vaccine after age 50, some cover this after age 69.  If your insurer covers this, then call to schedule appointment to have this vaccine here.  Exercise regularly, work on losing some weight  Try cutting out 500 calories per day in the diet.  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

## 2017-01-08 LAB — PSA: PSA: 1.2 ng/mL (ref <=4.0)

## 2017-01-08 LAB — HEMOGLOBIN A1C
Hgb A1c MFr Bld: 5.5 % (ref <5.7)
Mean Plasma Glucose: 111 mg/dL

## 2017-01-08 LAB — TESTOSTERONE: Testosterone: 442 ng/dL (ref 250–827)

## 2017-02-05 ENCOUNTER — Encounter: Payer: Self-pay | Admitting: Internal Medicine

## 2017-02-09 ENCOUNTER — Telehealth: Payer: Self-pay

## 2017-02-09 NOTE — Telephone Encounter (Signed)
Pt has not heard back from GI in regards to colonoscopy- please advise on update on referral. Aaron Patel December

## 2017-02-09 NOTE — Telephone Encounter (Signed)
Called and l/m  On voicemail to let pt know that gi had called him on 01/27/2017 and we mail out a letter to him to let him know to give gi call back to set up an appt.

## 2017-02-16 ENCOUNTER — Other Ambulatory Visit: Payer: Self-pay | Admitting: Family Medicine

## 2017-02-16 DIAGNOSIS — Z8249 Family history of ischemic heart disease and other diseases of the circulatory system: Secondary | ICD-10-CM

## 2017-02-16 DIAGNOSIS — E785 Hyperlipidemia, unspecified: Secondary | ICD-10-CM

## 2017-02-24 ENCOUNTER — Encounter: Payer: Self-pay | Admitting: Internal Medicine

## 2017-03-25 ENCOUNTER — Telehealth: Payer: Self-pay | Admitting: Medical

## 2017-03-25 ENCOUNTER — Ambulatory Visit (AMBULATORY_SURGERY_CENTER): Payer: Self-pay

## 2017-03-25 VITALS — Ht 72.0 in | Wt 230.0 lb

## 2017-03-25 DIAGNOSIS — Z1211 Encounter for screening for malignant neoplasm of colon: Secondary | ICD-10-CM

## 2017-03-25 MED ORDER — SUPREP BOWEL PREP KIT 17.5-3.13-1.6 GM/177ML PO SOLN: 1.0000 | Freq: Once | ORAL | 0 refills | Status: AC

## 2017-03-25 NOTE — Progress Notes (Signed)
No allergies to eggs or soy No diet meds No past problems with anesthesia No home oxygen  Registered emmi 

## 2017-03-25 NOTE — Telephone Encounter (Signed)
Pt emailed me a physical form. Sending back to Elliott per office protocol.

## 2017-03-29 ENCOUNTER — Encounter: Payer: Self-pay | Admitting: Internal Medicine

## 2017-03-31 NOTE — Telephone Encounter (Signed)
Pt will p/u in morning

## 2017-03-31 NOTE — Telephone Encounter (Signed)
Pt called back and he needs this form asap if we could get this form filled out he needs this for work, pt can be reached at [phone]

## 2017-03-31 NOTE — Telephone Encounter (Signed)
Did we finish this?

## 2017-04-08 ENCOUNTER — Encounter: Payer: Self-pay | Admitting: Internal Medicine

## 2017-04-08 ENCOUNTER — Ambulatory Visit (AMBULATORY_SURGERY_CENTER): Payer: BC Managed Care – PPO | Admitting: Internal Medicine

## 2017-04-08 VITALS — BP 117/76 | HR 74 | Temp 98.2°F | Resp 14 | Ht 71.0 in | Wt 231.0 lb

## 2017-04-08 DIAGNOSIS — Z1211 Encounter for screening for malignant neoplasm of colon: Secondary | ICD-10-CM

## 2017-04-08 DIAGNOSIS — Z1212 Encounter for screening for malignant neoplasm of rectum: Secondary | ICD-10-CM | POA: Diagnosis not present

## 2017-04-08 HISTORY — PX: COLONOSCOPY: SHX174

## 2017-04-08 MED ORDER — SODIUM CHLORIDE 0.9 % IV SOLN: 500.0000 mL | INTRAVENOUS | Status: DC

## 2017-04-08 NOTE — Op Note (Signed)
[facility] Patient Name: Aaron Patel Procedure Date: 04/08/2017 1:33 PM MRN: [id number] Endoscopist: Jerene Bears , MD Age: 51 Referring MD:  Date of Birth: 1966-04-21 Gender: Male Account #: [id number] Procedure:                Colonoscopy Indications:              Screening for colorectal malignant neoplasm, This                            is the patient's first colonoscopy Medicines:                Monitored Anesthesia Care Procedure:                Pre-Anesthesia Assessment:                           - Prior to the procedure, a History and Physical                            was performed, and patient medications and                            allergies were reviewed. The patient's tolerance of                            previous anesthesia was also reviewed. The risks                            and benefits of the procedure and the sedation                            options and risks were discussed with the patient.                            All questions were answered, and informed consent                            was obtained. Prior Anticoagulants: The patient has                            taken no previous anticoagulant or antiplatelet                            agents. ASA Grade Assessment: II - A patient with                            mild systemic disease. After reviewing the risks                            and benefits, the patient was deemed in                            satisfactory condition to undergo the procedure.  After obtaining informed consent, the colonoscope                            was passed under direct vision. Throughout the                            procedure, the patient's blood pressure, pulse, and                            oxygen saturations were monitored continuously. The                            Colonoscope was introduced through the anus and                            advanced to the the cecum,  identified by                            appendiceal orifice and ileocecal valve. The                            colonoscopy was performed without difficulty. The                            patient tolerated the procedure well. The quality                            of the bowel preparation was good. The ileocecal                            valve, appendiceal orifice, and rectum were                            photographed. Scope In: 1:41:24 PM Scope Out: 1:52:07 PM Scope Withdrawal Time: 0 hours 8 minutes 37 seconds  Total Procedure Duration: 0 hours 10 minutes 43 seconds  Findings:                 The digital rectal exam was normal.                           The entire examined colon appeared normal on direct                            and retroflexion views. Complications:            No immediate complications. Estimated Blood Loss:     Estimated blood loss: none. Impression:               - The entire examined colon is normal on direct and                            retroflexion views.                           - No specimens collected. Recommendation:           -  Patient has a contact number available for                            emergencies. The signs and symptoms of potential                            delayed complications were discussed with the                            patient. Return to normal activities tomorrow.                            Written discharge instructions were provided to the                            patient.                           - Resume previous diet.                           - Continue present medications.                           - Repeat colonoscopy in 10 years for screening                            purposes. Jerene Bears, MD 04/08/2017 1:59:07 PM This report has been signed electronically.

## 2017-04-08 NOTE — Progress Notes (Signed)
No problems noted in the recovery room. maw 

## 2017-04-08 NOTE — Progress Notes (Signed)
To recovery, report to RN, VSS. 

## 2017-04-08 NOTE — Patient Instructions (Signed)
YOU HAD AN ENDOSCOPIC PROCEDURE TODAY AT [facility]:   Refer to the procedure report that was given to you for any specific questions about what was found during the examination.  If the procedure report does not answer your questions, please call your gastroenterologist to clarify.  If you requested that your care partner not be given the details of your procedure findings, then the procedure report has been included in a sealed envelope for you to review at your convenience later.  YOU SHOULD EXPECT: Some feelings of bloating in the abdomen. Passage of more gas than usual.  Walking can help get rid of the air that was put into your GI tract during the procedure and reduce the bloating. If you had a lower endoscopy (such as a colonoscopy or flexible sigmoidoscopy) you may notice spotting of blood in your stool or on the toilet paper. If you underwent a bowel prep for your procedure, you may not have a normal bowel movement for a few days.  Please Note:  You might notice some irritation and congestion in your nose or some drainage.  This is from the oxygen used during your procedure.  There is no need for concern and it should clear up in a day or so.  SYMPTOMS TO REPORT IMMEDIATELY:   Following lower endoscopy (colonoscopy or flexible sigmoidoscopy):  Excessive amounts of blood in the stool  Significant tenderness or worsening of abdominal pains  Swelling of the abdomen that is new, acute  Fever of 100F or higher   For urgent or emergent issues, a gastroenterologist can be reached at any hour by calling [phone].   DIET:  We do recommend a small meal at first, but then you may proceed to your regular diet.  Drink plenty of fluids but you should avoid alcoholic beverages for 24 hours.  ACTIVITY:  You should plan to take it easy for the rest of today and you should NOT DRIVE or use heavy machinery until tomorrow (because of the sedation medicines used during the test).     FOLLOW UP: Our staff will call the number listed on your records the next business day following your procedure to check on you and address any questions or concerns that you may have regarding the information given to you following your procedure. If we do not reach you, we will leave a message.  However, if you are feeling well and you are not experiencing any problems, there is no need to return our call.  We will assume that you have returned to your regular daily activities without incident.  If any biopsies were taken you will be contacted by phone or by letter within the next 1-3 weeks.  Please call us at [phone] if you have not heard about the biopsies in 3 weeks.    SIGNATURES/CONFIDENTIALITY: You and/or your care partner have signed paperwork which will be entered into your electronic medical record.  These signatures attest to the fact that that the information above on your After Visit Summary has been reviewed and is understood.  Full responsibility of the confidentiality of this discharge information lies with you and/or your care-partner.     You may resume your current medications today. Repeat screening colonoscopy in 10 years. Please call if any questions or concerns.

## 2017-04-09 ENCOUNTER — Telehealth: Payer: Self-pay | Admitting: *Deleted

## 2017-04-09 NOTE — Telephone Encounter (Signed)
  Follow up Call-  Call back number 04/08/2017  Post procedure Call Back phone  # [phone]  Permission to leave phone message Yes  Some recent data might be hidden     Patient questions:  Do you have a fever, pain , or abdominal swelling? No. Pain Score  0 *  Have you tolerated food without any problems? Yes.    Have you been able to return to your normal activities? Yes.    Do you have any questions about your discharge instructions: Diet   No. Medications  No. Follow up visit  No.  Do you have questions or concerns about your Care? No.  Actions: * If pain score is 4 or above: No action needed, pain <4.

## 2017-05-27 ENCOUNTER — Emergency Department (HOSPITAL_COMMUNITY)
Admission: EM | Admit: 2017-05-27 | Discharge: 2017-05-27 | Disposition: A | Discharge status: Home / Self Care | Payer: BC Managed Care – PPO | Attending: Emergency Medicine | Admitting: Emergency Medicine

## 2017-05-27 ENCOUNTER — Encounter (HOSPITAL_COMMUNITY): Payer: Self-pay | Admitting: Emergency Medicine

## 2017-05-27 DIAGNOSIS — M25562 Pain in left knee: Secondary | ICD-10-CM | POA: Diagnosis present

## 2017-05-27 DIAGNOSIS — Z5321 Procedure and treatment not carried out due to patient leaving prior to being seen by health care provider: Secondary | ICD-10-CM | POA: Insufficient documentation
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
## 2017-12-24 ENCOUNTER — Ambulatory Visit: Payer: Self-pay | Admitting: Medical

## 2017-12-27 ENCOUNTER — Ambulatory Visit: Payer: BC Managed Care – PPO | Admitting: Medical

## 2017-12-27 VITALS — BP 120/76 | HR 87 | Temp 98.0°F | Resp 16 | Ht 72.0 in | Wt 227.4 lb

## 2017-12-27 DIAGNOSIS — R202 Paresthesia of skin: Secondary | ICD-10-CM

## 2017-12-27 DIAGNOSIS — I889 Nonspecific lymphadenitis, unspecified: Secondary | ICD-10-CM

## 2017-12-27 DIAGNOSIS — M542 Cervicalgia: Secondary | ICD-10-CM

## 2017-12-27 MED ORDER — CYCLOBENZAPRINE HCL 10 MG PO TABS
ORAL_TABLET | ORAL | 0 refills | Status: DC
Start: 2017-12-27 — End: 2018-04-18

## 2017-12-27 NOTE — Progress Notes (Signed)
Subjective: Chief Complaint  Patient presents with  . neck stiffness    neck stiffness, X 1 week swollen nodes in right side    Here for neck stiffness, swollen nodes in right side.  He notes for the last week he has had some right neck stiffness and now seems to be having some left neck stiffness as well.  He has been doing some Pilates.  Denies any other trauma or injury.  No fever.  No upper respiratory symptoms.  Has had some neck stiffness and strain over the last few months a couple different times  He also notes recently having a swollen lymph node on the right side of his neck is better today but it was somewhat swollen and tender last week.  Again no recent cold symptoms, no mouth issues no sores no tooth issue.  He has some right hand tingling and numbness occasional.  No other arm pain numbness or tingling or weakness.  Past Medical History:  Diagnosis Date  . Chest pain    cath 12/31/10: post AV CFX < 10% (no significant CAD), EF 60%   . Family history of ischemic heart disease   . GERD (gastroesophageal reflux disease)    intermittent  . HLD (hyperlipidemia)   . Hx of echocardiogram Aug 2012   Normal  . Lipoma    Dr. Greer Pickerel, consult 2013, pending surgery  . Obesity   . Palpitations    recheck with cardiology 02/2012.  Marland Kitchen Peyronie disease    Urology consult prior   Current Outpatient Medications on File Prior to Visit  Medication Sig Dispense Refill  . lovastatin (MEVACOR) 40 MG tablet TAKE ONE TABLET BY MOUTH AT BEDTIME 90 tablet 3   No current facility-administered medications on file prior to visit.    ROS as in subjective    Objective: BP 120/76   Pulse 87   Temp 98 F (36.7 C) (Oral)   Resp 16   Ht 6' (1.829 m)   Wt 227 lb 6.4 oz (103.1 kg)   SpO2 96%   BMI 30.84 kg/m   Wt Readings from Last 3 Encounters:  12/27/17 227 lb 6.4 oz (103.1 kg)  04/08/17 231 lb (104.8 kg)  03/25/17 230 lb (104.3 kg)   Gen: wd, wn, nad Skin: No rash, no  erythema, bruising Neck nontender, he reports mild pain with neck rotation to the left but no obvious deformity or mass or swelling No obvious lymphadenopathy today no thyromegaly Arms nontender, normal range of motion, no deformity Arms neurovascularly intact HEENT unremarkable    Assessment: Encounter Diagnoses  Name Primary?  . Lymphadenitis Yes  . Neck pain   . Paresthesias in right hand     Plan: Lymphadenitis-lab today, but reassured no obvious abnormality on exam today.  Discussed reasons to have lymph nodes mildly inflamed short-term.  CBC today to further evaluate his request   neck pain- can use anti-inflammatory or muscle fracture below for short-term use.  Advised daily stretching routine, can get massage as needed.  If this continues to be a problem and less consider neck imaging  Paresthesia and hand-possible short-term inflammation versus mild CTS.  Advised over-the-counter wrist splint short-term the next 2 weeks nightly use   Ege was seen today for neck stiffness.  Diagnoses and all orders for this visit:  Lymphadenitis -     CBC  Neck pain  Paresthesias in right hand  Other orders -     cyclobenzaprine (FLEXERIL) 10 MG tablet; 1/2-  1 tablet po QHS prn

## 2017-12-28 LAB — CBC
Hematocrit: 43.9 % (ref 37.5–51.0)
Hemoglobin: 15.1 g/dL (ref 13.0–17.7)
MCH: 30.2 pg (ref 26.6–33.0)
MCHC: 34.4 g/dL (ref 31.5–35.7)
MCV: 88 fL (ref 79–97)
PLATELETS: 319 10*3/uL (ref 150–450)
RBC: 5 x10E6/uL (ref 4.14–5.80)
RDW: 14 % (ref 12.3–15.4)
WBC: 6.5 10*3/uL (ref 3.4–10.8)

## 2018-01-14 ENCOUNTER — Ambulatory Visit: Payer: BC Managed Care – PPO | Admitting: Medical

## 2018-01-14 ENCOUNTER — Encounter: Payer: Self-pay | Admitting: Medical

## 2018-01-14 VITALS — BP 120/90 | HR 76 | Temp 98.0°F | Resp 16 | Ht 72.0 in | Wt 229.2 lb

## 2018-01-14 DIAGNOSIS — E669 Obesity, unspecified: Secondary | ICD-10-CM | POA: Diagnosis not present

## 2018-01-14 DIAGNOSIS — Z20828 Contact with and (suspected) exposure to other viral communicable diseases: Secondary | ICD-10-CM | POA: Insufficient documentation

## 2018-01-14 DIAGNOSIS — Z6832 Body mass index (BMI) 32.0-32.9, adult: Secondary | ICD-10-CM

## 2018-01-14 DIAGNOSIS — E785 Hyperlipidemia, unspecified: Secondary | ICD-10-CM | POA: Diagnosis not present

## 2018-01-14 DIAGNOSIS — Z0000 Encounter for general adult medical examination without abnormal findings: Secondary | ICD-10-CM

## 2018-01-14 DIAGNOSIS — Z113 Encounter for screening for infections with a predominantly sexual mode of transmission: Secondary | ICD-10-CM

## 2018-01-14 DIAGNOSIS — Z8249 Family history of ischemic heart disease and other diseases of the circulatory system: Secondary | ICD-10-CM | POA: Diagnosis not present

## 2018-01-14 LAB — POCT URINALYSIS DIP (PROADVANTAGE DEVICE)
BILIRUBIN UA: NEGATIVE
GLUCOSE UA: NEGATIVE mg/dL
Ketones, POC UA: NEGATIVE mg/dL
Leukocytes, UA: NEGATIVE
NITRITE UA: NEGATIVE
Protein Ur, POC: NEGATIVE mg/dL
RBC UA: NEGATIVE
Specific Gravity, Urine: 1.03
UUROB: 3.5
pH, UA: 6 (ref 5.0–8.0)

## 2018-01-14 NOTE — Progress Notes (Signed)
Subjective:   HPI  Aaron Patel. is a 52 y.o. male who presents for Chief Complaint  Patient presents with  . cpe    fasting cpe    Medical team: NP Truitt Merle, cardiology Podiatry Sees eye doctor and dentist Arhianna Ebey, Camelia Eng, PA-C and Dorothea Ogle, PA-C here  Concerns: Wife recently had lesion on buttock that she has had twice in last 6 months, tested + for herpes.  He has questions about this and wants testing.  Reviewed their medical, surgical, family, social, medication, and allergy history and updated chart as appropriate.  Past Medical History:  Diagnosis Date  . Chest pain    cath 12/31/10: post AV CFX < 10% (no significant CAD), EF 60%   . Family history of ischemic heart disease   . GERD (gastroesophageal reflux disease)    intermittent  . HLD (hyperlipidemia)   . Hx of echocardiogram Aug 2012   Normal  . Lipoma    Dr. Greer Pickerel, consult 2013, pending surgery  . Obesity   . Palpitations    recheck with cardiology 02/2012.  Marland Kitchen Peyronie disease    Urology consult prior    Past Surgical History:  Procedure Laterality Date  . CARDIAC CATHETERIZATION  12/2010   Dr. Marigene Ehlers   . LIPOMA EXCISION N/A 01/03/2016   Procedure: EXCISION LOWER BACK LIPOMA;  Surgeon: Greer Pickerel, MD;  Location: [facility];  Service: General;  Laterality: N/A;  . stye surgery      Social History   Socioeconomic History  . Marital status: Married    Spouse name: Not on file  . Number of children: Not on file  . Years of education: Not on file  . Highest education level: Not on file  Occupational History  . 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 Financial resource strain: Not on file  . Food insecurity:    Worry: Not on file    Inability: Not on file  . Transportation needs:    Medical: Not on file    Non-medical: Not on file  Tobacco Use  . Smoking status: Former Smoker    Packs/day:  0.00    Years: 0.50    Pack years: 0.00    Last attempt to quit: 06/08/2006    Years since quitting: 11.6  . Smokeless tobacco: Never Used  . Tobacco comment: recreational smoking, not every day, quit in 2008  Substance and Sexual Activity  . Alcohol use: Yes    Alcohol/week: 1.0 standard drinks    Types: 1 Shots of liquor per week    Comment: occasionally  . Drug use: No  . Sexual activity: Yes  Lifestyle  . Physical activity:    Days per week: Not on file    Minutes per session: Not on file  . Stress: Not on file  Relationships  . Social connections:    Talks on phone: Not on file    Gets together: Not on file    Attends religious service: Not on file    Active member of club or organization: Not on file    Attends meetings of clubs or organizations: Not on file    Relationship status: Not on file  . Intimate partner violence:    Fear of current or ex partner: Not on file    Emotionally abused: Not on file    Physically abused: Not on file  Forced sexual activity: Not on file  Other Topics Concern  . Not on file  Social History Narrative   Basketball, coaches basketball, married, 2 children both teenagers.  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  Exercise - officiate basketball,running up and down the court.  [employer].  01/2017    Family History  Problem Relation Age of Onset  . Asthma Mother   . Mental illness Mother   . Heart disease Mother        died of CHF  . Arthritis Father   . Diabetes Father   . Heart disease Father        heart transplant, died of heart disease 5 years later  . Hypertension Brother   . Arthritis Paternal Aunt   . Diabetes Paternal Aunt   . Arthritis Paternal Uncle   . Diabetes Paternal Uncle   . Clotting disorder Maternal Aunt   . Colon cancer Maternal Aunt   . Cancer Maternal Grandmother   . Stroke Neg Hx      Current Outpatient Medications:  .  cyclobenzaprine (FLEXERIL) 10 MG tablet, 1/2- 1 tablet po QHS prn, Disp: 12 tablet, Rfl: 0 .   lovastatin (MEVACOR) 40 MG tablet, TAKE ONE TABLET BY MOUTH AT BEDTIME, Disp: 90 tablet, Rfl: 3  Allergies  Allergen Reactions  . Iodinated Diagnostic Agents Hives and Rash       Review of Systems Constitutional: -fever, -chills, -sweats, -unexpected weight change, -decreased appetite, -fatigue Allergy: -sneezing, -itching, -congestion Dermatology: -changing moles, --rash, -lumps ENT: -runny nose, -ear pain, -sore throat, -hoarseness, -sinus pain, -teeth pain, - ringing in ears, -hearing loss, -nosebleeds Cardiology: -chest pain, -palpitations, -swelling, -difficulty breathing when lying flat, -waking up short of breath Respiratory: -cough, -shortness of breath, -difficulty breathing with exercise or exertion, -wheezing, -coughing up blood Gastroenterology: -abdominal pain, -nausea, -vomiting, -diarrhea, -constipation, -blood in stool, -changes in bowel movement, -difficulty swallowing or eating Hematology: -bleeding, -bruising  Musculoskeletal: -joint aches, -muscle aches, -joint swelling, -back pain, -neck pain, -cramping, -changes in gait Ophthalmology: denies vision changes, eye redness, itching, discharge Urology: -burning with urination, -difficulty urinating, -blood in urine, -urinary frequency, -urgency, -incontinence Neurology: -headache, -weakness, -tingling, -numbness, -memory loss, -falls, -dizziness Psychology: -depressed mood, -agitation, -sleep problems     Objective:   BP 120/90   Pulse 76   Temp 98 F (36.7 C) (Oral)   Resp 16   Ht 6' (1.829 m)   Wt 229 lb 3.2 oz (104 kg)   SpO2 99%   BMI 31.09 kg/m   General appearance: alert, no distress, WD/WN, African American male Skin: tattoo brand scar on left upper arm, chinese characters/words tattoo on right upper arm, few scattered benign lesions HEENT: normocephalic, conjunctiva/corneas normal, sclerae anicteric, PERRLA, EOMi, nares patent, no discharge or erythema, pharynx normal Oral cavity: MMM, tongue normal,  teeth normal Neck: supple, no lymphadenopathy, no thyromegaly, no masses, normal ROM, no bruits Chest: non tender, normal shape and expansion Heart: RRR, normal S1, S2, no murmurs Lungs: CTA bilaterally, no wheezes, rhonchi, or rales Abdomen: +bs, soft, non tender, non distended, no masses, no hepatomegaly, no splenomegaly, no bruits Back: lower back surgical scar, otherwise non tender, normal ROM, no scoliosis Musculoskeletal: upper extremities non tender, no obvious deformity, normal ROM throughout, lower extremities non tender, no obvious deformity, normal ROM throughout Extremities: no edema, no cyanosis, no clubbing Pulses: 2+ symmetric, upper and lower extremities, normal cap refill Neurological: alert, oriented x 3, CN2-12 intact, strength normal upper extremities and lower extremities, sensation normal throughout, DTRs  2+ throughout, no cerebellar signs, gait normal Psychiatric: normal affect, behavior normal, pleasant  GU: normal male external genitalia,circumcised, nontender, no masses, no hernia, no lymphadenopathy Rectal: deferred   Assessment and Plan :    Encounter Diagnoses  Name Primary?  . Routine general medical examination at a health care facility Yes  . Hyperlipidemia, unspecified hyperlipidemia type   . Class 1 obesity with serious comorbidity and body mass index (BMI) of 32.0 to 32.9 in adult, unspecified obesity type   . Family history of heart disease in male family member before age 88   . Exposure to herpes   . Screen for STD (sexually transmitted disease)     Physical exam - discussed and counseled on healthy lifestyle, diet, exercise, preventative care, vaccinations, sick and well care, proper use of emergency dept and after hours care, and addressed their concerns.    Health screening: See your eye doctor yearly for routine vision care. See your dentist yearly for routine dental care including hygiene visits twice yearly.  I reviewed his 11/2016 cardiac  stress test that was normal I reviewed his 04/2017 colonoscopy that was normal I reviewed his 2018 PSA  Vaccinations: Counseled on the following vaccines:  influenza Shingles Shingles vaccine:  I recommend you have a shingles vaccine to help prevent shingles or herpes zoster outbreak.   Please call your insurer to inquire about coverage for the Shingrix vaccine given in 2 doses.   Some insurers cover this vaccine after age 33, some cover this after age 52.  If your insurer covers this, then call to schedule appointment to have this vaccine here.  Acute issues discussed: We discussed herpes infection, screening, precautions when wife gets outbreak.      Jaamal was seen today for cpe.  Diagnoses and all orders for this visit:  Routine general medical examination at a health care facility -     POCT Urinalysis DIP (Proadvantage Device) -     Lipid panel -     Comprehensive metabolic panel -     Hemoglobin A1c -     HSV 1 antibody, IgG -     HSV 2 antibody, IgG  Hyperlipidemia, unspecified hyperlipidemia type -     Lipid panel  Class 1 obesity with serious comorbidity and body mass index (BMI) of 32.0 to 32.9 in adult, unspecified obesity type  Family history of heart disease in male family member before age 65  Exposure to herpes -     HSV 1 antibody, IgG -     HSV 2 antibody, IgG  Screen for STD (sexually transmitted disease) -     HSV 1 antibody, IgG -     HSV 2 antibody, IgG   Follow-up pending labs, yearly for physical

## 2018-01-14 NOTE — Patient Instructions (Signed)
Thanks for trusting [place] with your health care and for coming in for a physical today.  Below are some general recommendations I have for you:  Yearly screenings See your eye doctor yearly for routine vision care. See your dentist yearly for routine dental care including hygiene visits twice yearly. See me here yearly for a routine physical and preventative care visit   Specific Concerns today:   Shingles vaccine:  I recommend you have a shingles vaccine to help prevent shingles or herpes zoster outbreak.   Please call your insurer to inquire about coverage for the Shingrix vaccine given in 2 doses.   Some insurers cover this vaccine after age 76, some cover this after age 52.  If your insurer covers this, then call to schedule appointment to have this vaccine here.  Herpes Simplex Test There are two common types of herpes simplex virus (HSV). These are classified as Type 1 (HSV1) or Type 2 (HSV2). Type 1 often causes cold sores on or around the mouth and sometimes on or around the eyes. Type 2 is commonly known as a sexually transmitted infection that causes sores in and around the genitals. Both types of herpes simplex can cause sores in different areas. There are two types of herpes simplex tests. These include:  Culture. This consists of collecting and testing a sample of fluid with a cotton swab from an open sore. This can only be done during an active infection (outbreak). Culture tests take several days to complete but are very accurate.  HSV blood tests. This test is not as accurate as a culture. However, HSV blood tests are faster than cultures, often providing a test result within one day. ? HSV antibody test. This checks for the presence of antibodies against HSV in your blood. Antibodies are proteins your body makes to help fight infection. ? HSV antigen test. This checks for the presence of the HSV germ (antigen) in your blood.  Your health care provider may recommend you have a HSV  test if:  He or she believes you have a HSV infection.  You have a weakened immune system (immunocompromised) and you have sores around your mouth or genitals that look like HSV eruptions.  You have a fever of unknown origin (FUO).  You are pregnant, have herpes, and are expecting to deliver a baby vaginally in the next 6-8 weeks.  What do the results mean? It is your responsibility to obtain your test results. Ask the lab or department performing the test when and how you will get your results. Contact your health care provider to discuss any questions you have about your results. Range of Normal Values Ranges for normal values may vary among different labs and hospitals. You should always check with your health care provider after having lab work or other tests done to discuss whether your values are considered within normal limits. Normal findings include:  No HSV antigen or antibodies present in your blood.  No HSV antigen present in cultured fluid.  Meaning of Results Outside Normal Ranges The following test results may indicate that you have an active HSV infection:  Positive culture for HSV1 or HSV2.  Presence of HSV1 or HSV2 antigens in your blood.  Presence of certain HSV1 or HSV2 antibodies (IgM) in your blood.  Discuss your test results with your health care provider. He or she will use the results to make a diagnosis and determine a treatment plan that is right for you. Talk with your health care  provider to discuss your results, treatment options, and if necessary, the need for more tests. Talk with your health care provider if you have any questions about your results. This information is not intended to replace advice given to you by your health care provider. Make sure you discuss any questions you have with your health care provider. Document Released: 06/27/2004 Document Revised: 01/29/2016 Document Reviewed: 10/10/2013 Elsevier Interactive Patient Education  2018  [employer].    Please follow up yearly for a physical.   Preventative Care for Adults - Male      [place]:  A routine yearly physical is a good way to check in with your primary care provider about your health and preventive screening. It is also an opportunity to share updates about your health and any concerns you have, and receive a thorough all-over exam.   Most health insurance companies pay for at least some preventative services.  Check with your health plan for specific coverages.  WHAT PREVENTATIVE SERVICES DO WOMEN NEED?  Adult men should have their weight and blood pressure checked regularly.   Men age 86 and older should have their cholesterol levels checked regularly.  Beginning at age 19 and continuing to age 5, men should be screened for colorectal cancer.  Certain people may need continued testing until age 80.  Updating vaccinations is part of preventative care.  Vaccinations help protect against diseases such as the flu.  Osteoporosis is a disease in which the bones lose minerals and strength as we age. Men ages 67 and over should discuss this with their caregivers  Lab tests are generally done as part of preventative care to screen for anemia and blood disorders, to screen for problems with the kidneys and liver, to screen for bladder problems, to check blood sugar, and to check your cholesterol level.  Preventative services generally include counseling about diet, exercise, avoiding tobacco, drugs, excessive alcohol consumption, and sexually transmitted infections.    GENERAL RECOMMENDATIONS FOR GOOD HEALTH:  Healthy diet:  Eat a variety of foods, including fruit, vegetables, animal or vegetable protein, such as meat, fish, chicken, and eggs, or beans, lentils, tofu, and grains, such as rice.  Drink plenty of water daily.  Decrease saturated fat in the diet, avoid lots of red meat, processed foods, sweets, fast foods, and fried  foods.  Exercise:  Aerobic exercise helps maintain good heart health. At least 30-40 minutes of moderate-intensity exercise is recommended. For example, a brisk walk that increases your heart rate and breathing. This should be done on most days of the week.   Find a type of exercise or a variety of exercises that you enjoy so that it becomes a part of your daily life.  Examples are running, walking, swimming, water aerobics, and biking.  For motivation and support, explore group exercise such as aerobic class, spin class, Zumba, Yoga,or  martial arts, etc.    Set exercise goals for yourself, such as a certain weight goal, walk or run in a race such as a 5k walk/run.  Speak to your primary care provider about exercise goals.  Disease prevention:  If you smoke or chew tobacco, find out from your caregiver how to quit. It can literally save your life, no matter how long you have been a tobacco user. If you do not use tobacco, never begin.   Maintain a healthy diet and normal weight. Increased weight leads to problems with blood pressure and diabetes.   The Body Mass  Index or BMI is a way of measuring how much of your body is fat. Having a BMI above 27 increases the risk of heart disease, diabetes, hypertension, stroke and other problems related to obesity. Your caregiver can help determine your BMI and based on it develop an exercise and dietary program to help you achieve or maintain this important measurement at a healthful level.  High blood pressure causes heart and blood vessel problems.  Persistent high blood pressure should be treated with medicine if weight loss and exercise do not work.   Fat and cholesterol leaves deposits in your arteries that can block them. This causes heart disease and vessel disease elsewhere in your body.  If your cholesterol is found to be high, or if you have heart disease or certain other medical conditions, then you may need to have your cholesterol monitored  frequently and be treated with medication.   Ask if you should have a cardiac stress test if your history suggests this. A stress test is a test done on a treadmill that looks for heart disease. This test can find disease prior to there being a problem.  Osteoporosis is a disease in which the bones lose minerals and strength as we age. This can result in serious bone fractures. Risk of osteoporosis can be identified using a bone density scan. Men ages 53 and over should discuss this with their caregivers. Ask your caregiver whether you should be taking a calcium supplement and Vitamin D, to reduce the rate of osteoporosis.   Avoid drinking alcohol in excess (more than two drinks per day).  Avoid use of street drugs. Do not share needles with anyone. Ask for professional help if you need assistance or instructions on stopping the use of alcohol, cigarettes, and/or drugs.  Brush your teeth twice a day with fluoride toothpaste, and floss once a day. Good oral hygiene prevents tooth decay and gum disease. The problems can be painful, unattractive, and can cause other health problems. Visit your dentist for a routine oral and dental check up and preventive care every 6-12 months.   Look at your skin regularly.  Use a mirror to look at your back. Notify your caregivers of changes in moles, especially if there are changes in shapes, colors, a size larger than a pencil eraser, an irregular border, or development of new moles.  Safety:  Use seatbelts 100% of the time, whether driving or as a passenger.  Use safety devices such as hearing protection if you work in environments with loud noise or significant background noise.  Use safety glasses when doing any work that could send debris in to the eyes.  Use a helmet if you ride a bike or motorcycle.  Use appropriate safety gear for contact sports.  Talk to your caregiver about gun safety.  Use sunscreen with a SPF (or skin protection factor) of 15 or greater.   Lighter skinned people are at a greater risk of skin cancer. Don't forget to also wear sunglasses in order to protect your eyes from too much damaging sunlight. Damaging sunlight can accelerate cataract formation.   Practice safe sex. Use condoms. Condoms are used for birth control and to help reduce the spread of sexually transmitted infections (or STIs).  Some of the STIs are gonorrhea (the clap), chlamydia, syphilis, trichomonas, herpes, HPV (human papilloma virus) and HIV (human immunodeficiency virus) which causes AIDS. The herpes, HIV and HPV are viral illnesses that have no cure. These can result in disability, cancer  and death.   Keep carbon monoxide and smoke detectors in your home functioning at all times. Change the batteries every 6 months or use a model that plugs into the wall.   Vaccinations:  Stay up to date with your tetanus shots and other required immunizations. You should have a booster for tetanus every 10 years. Be sure to get your flu shot every year, since 5%-20% of the [place] population comes down with the flu. The flu vaccine changes each year, so being vaccinated once is not enough. Get your shot in the fall, before the flu season peaks.   Other vaccines to consider:  Human Papilloma Virus or HPV causes cancer of the cervix, and other infections that can be transmitted from person to person. There is a vaccine for HPV, and males should get immunized between the ages of 1 and 93. It requires a series of 3 shots.   Pneumococcal vaccine to protect against certain types of pneumonia.  This is normally recommended for adults age 59 or older.  However, adults younger than 51 years old with certain underlying conditions such as diabetes, heart or lung disease should also receive the vaccine.  Shingles vaccine to protect against Varicella Zoster if you are older than age 33, or younger than 51 years old with certain underlying illness.  If you have not had the Shingrix vaccine,  please call your insurer to inquire about coverage for the Shingrix vaccine given in 2 doses.   Some insurers cover this vaccine after age 2, some cover this after age 70.  If your insurer covers this, then call to schedule appointment to have this vaccine here  Hepatitis A vaccine to protect against a form of infection of the liver by a virus acquired from food.  Hepatitis B vaccine to protect against a form of infection of the liver by a virus acquired from blood or body fluids, particularly if you work in health care.  If you plan to travel internationally, check with your local health department for specific vaccination recommendations.   What should I know about cancer screening? Many types of cancers can be detected early and may often be prevented. Lung Cancer  You should be screened every year for lung cancer if: ? You are a current smoker who has smoked for at least 30 years. ? You are a former smoker who has quit within the past 15 years.  Talk to your health care provider about your screening options, when you should start screening, and how often you should be screened.  Colorectal Cancer  Routine colorectal cancer screening usually begins at 51 years of age and should be repeated every 5-10 years until you are 51 years old. You may need to be screened more often if early forms of precancerous polyps or small growths are found. Your health care provider may recommend screening at an earlier age if you have risk factors for colon cancer.  Your health care provider may recommend using home test kits to check for hidden blood in the stool.  A small camera at the end of a tube can be used to examine your colon (sigmoidoscopy or colonoscopy). This checks for the earliest forms of colorectal cancer.  Prostate and Testicular Cancer  Depending on your age and overall health, your health care provider may do certain tests to screen for prostate and testicular cancer.  Talk to your  health care provider about any symptoms or concerns you have about testicular or prostate cancer.  Skin Cancer  Check your skin from head to toe regularly.  Tell your health care provider about any new moles or changes in moles, especially if: ? There is a change in a mole's size, shape, or color. ? You have a mole that is larger than a pencil eraser.  Always use sunscreen. Apply sunscreen liberally and repeat throughout the day.  Protect yourself by wearing long sleeves, pants, a wide-brimmed hat, and sunglasses when outside.

## 2018-01-15 LAB — LIPID PANEL
CHOL/HDL RATIO: 3.6 ratio (ref 0.0–5.0)
CHOLESTEROL TOTAL: 192 mg/dL (ref 100–199)
HDL: 53 mg/dL (ref >39)
LDL CALC: 122 mg/dL — AB (ref 0–99)
Triglycerides: 87 mg/dL (ref 0–149)
VLDL Cholesterol Cal: 17 mg/dL (ref 5–40)

## 2018-01-15 LAB — COMPREHENSIVE METABOLIC PANEL
A/G RATIO: 1.5 (ref 1.2–2.2)
ALBUMIN: 4.3 g/dL (ref 3.5–5.5)
ALK PHOS: 109 IU/L (ref 39–117)
ALT: 20 IU/L (ref 0–44)
AST: 16 IU/L (ref 0–40)
BUN / CREAT RATIO: 14 (ref 9–20)
BUN: 16 mg/dL (ref 6–24)
Bilirubin Total: 0.4 mg/dL (ref 0.0–1.2)
CO2: 26 mmol/L (ref 20–29)
CREATININE: 1.17 mg/dL (ref 0.76–1.27)
Calcium: 9.4 mg/dL (ref 8.7–10.2)
Chloride: 100 mmol/L (ref 96–106)
GFR calc Af Amer: 83 mL/min/{1.73_m2} (ref >59)
GFR, EST NON AFRICAN AMERICAN: 72 mL/min/{1.73_m2} (ref >59)
Globulin, Total: 2.9 g/dL (ref 1.5–4.5)
Glucose: 87 mg/dL (ref 65–99)
POTASSIUM: 4.5 mmol/L (ref 3.5–5.2)
SODIUM: 139 mmol/L (ref 134–144)
Total Protein: 7.2 g/dL (ref 6.0–8.5)

## 2018-01-15 LAB — HEMOGLOBIN A1C
Est. average glucose Bld gHb Est-mCnc: 114 mg/dL
HEMOGLOBIN A1C: 5.6 % (ref 4.8–5.6)

## 2018-01-15 LAB — HSV 1 ANTIBODY, IGG: HSV 1 Glycoprotein G Ab, IgG: <0.91 index (ref 0.00–0.90)

## 2018-01-15 LAB — HSV 2 ANTIBODY, IGG: HSV 2 IgG, Type Spec: 19.4 index — ABNORMAL HIGH (ref 0.00–0.90)

## 2018-01-17 ENCOUNTER — Other Ambulatory Visit: Payer: Self-pay | Admitting: Medical

## 2018-01-17 DIAGNOSIS — Z8249 Family history of ischemic heart disease and other diseases of the circulatory system: Secondary | ICD-10-CM

## 2018-01-17 DIAGNOSIS — E785 Hyperlipidemia, unspecified: Secondary | ICD-10-CM

## 2018-01-17 MED ORDER — LOVASTATIN 40 MG PO TABS: 40.0000 mg | ORAL_TABLET | Freq: Every day | ORAL | 3 refills | Status: DC

## 2018-02-08 ENCOUNTER — Telehealth: Payer: Self-pay | Admitting: Medical

## 2018-02-08 NOTE — Telephone Encounter (Signed)
Pt dropped off form to be filled out put in your folder call pt at [phone] when ready to picked up

## 2018-02-09 NOTE — Telephone Encounter (Signed)
Called and leftvoice that form was ready

## 2018-02-09 NOTE — Telephone Encounter (Signed)
Form completed, ready to fax back

## 2018-02-26 ENCOUNTER — Other Ambulatory Visit: Payer: Self-pay | Admitting: Podiatry

## 2018-04-13 ENCOUNTER — Encounter (INDEPENDENT_AMBULATORY_CARE_PROVIDER_SITE_OTHER): Payer: Self-pay | Admitting: Orthopedic Surgery

## 2018-04-13 ENCOUNTER — Ambulatory Visit (INDEPENDENT_AMBULATORY_CARE_PROVIDER_SITE_OTHER): Payer: BC Managed Care – PPO | Admitting: Orthopedic Surgery

## 2018-04-13 DIAGNOSIS — M5441 Lumbago with sciatica, right side: Secondary | ICD-10-CM | POA: Diagnosis not present

## 2018-04-13 MED ORDER — DICLOFENAC SODIUM 75 MG PO TBEC: DELAYED_RELEASE_TABLET | ORAL | 0 refills | Status: DC

## 2018-04-13 MED ORDER — METHOCARBAMOL 500 MG PO TABS: ORAL_TABLET | ORAL | 0 refills | Status: DC
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
## 2018-04-18 ENCOUNTER — Ambulatory Visit: Payer: BC Managed Care – PPO | Admitting: Medical

## 2018-04-18 ENCOUNTER — Encounter: Payer: Self-pay | Admitting: Medical

## 2018-04-18 VITALS — BP 142/80 | HR 74 | Temp 98.3°F | Ht 72.0 in | Wt 229.4 lb

## 2018-04-18 DIAGNOSIS — E669 Obesity, unspecified: Secondary | ICD-10-CM | POA: Diagnosis not present

## 2018-04-18 DIAGNOSIS — Z8249 Family history of ischemic heart disease and other diseases of the circulatory system: Secondary | ICD-10-CM

## 2018-04-18 DIAGNOSIS — Z6832 Body mass index (BMI) 32.0-32.9, adult: Secondary | ICD-10-CM

## 2018-04-18 DIAGNOSIS — E785 Hyperlipidemia, unspecified: Secondary | ICD-10-CM

## 2018-04-18 DIAGNOSIS — R030 Elevated blood-pressure reading, without diagnosis of hypertension: Secondary | ICD-10-CM | POA: Diagnosis not present

## 2018-04-18 NOTE — Progress Notes (Signed)
Subjective: Chief Complaint  Patient presents with  . Follow-up   Here for follow-up from physical visit in August.  He has questions about need to continue statin, goals of therapy, alternate ways of treatment, possible side effects of statin.  Also here for recheck and elevated blood pressure from last visit.  Denies chest pain shortness of breath or decreased exercise tolerance.  No other complaint.  Past Medical History:  Diagnosis Date  . Chest pain    cath 12/31/10: post AV CFX < 10% (no significant CAD), EF 60%   . Family history of ischemic heart disease   . GERD (gastroesophageal reflux disease)    intermittent  . HLD (hyperlipidemia)   . Hx of cardiovascular stress test 11/2016   normal exercise stress test  . Hx of echocardiogram Aug 2012   Normal  . Lipoma    Dr. Greer Pickerel, consult 2013, pending surgery  . Obesity   . Peyronie disease    Urology consult prior   Family History  Problem Relation Age of Onset  . Asthma Mother   . Mental illness Mother   . Heart disease Mother 42       died of CHF  . Arthritis Father   . Diabetes Father   . Heart disease Father 67       heart transplant, died of heart disease 5 years later  . Hypertension Brother   . Arthritis Paternal Aunt   . Diabetes Paternal Aunt   . Arthritis Paternal Uncle   . Diabetes Paternal Uncle   . Clotting disorder Maternal Aunt   . Colon cancer Maternal Aunt   . Cancer Maternal Grandmother   . Stroke Neg Hx    ROS as in subjective    Objective: BP (!) 142/80   Pulse 74   Temp 98.3 F (36.8 C) (Oral)   Ht 6' (1.829 m)   Wt 229 lb 6.4 oz (104.1 kg)   SpO2 97%   BMI 31.11 kg/m   Gen: wd, wn, nad Otherwise not examined    Assessment: Encounter Diagnoses  Name Primary?  . Hyperlipidemia, unspecified hyperlipidemia type Yes  . Family history of premature CAD   . Class 1 obesity with serious comorbidity and body mass index (BMI) of 32.0 to 32.9 in adult, unspecified obesity type    . Elevated systolic blood pressure reading without diagnosis of hypertension      Plan: We reviewed his family history including premature family history of heart disease and both parents. We reviewed his American Heart Association cardiac risk calculator with a score of 6.3% chance of heart disease in the next 10 years We reviewed the Framingham risk calculator with a score below 5% risk in the next 10 years Although his risk calculator is just slightly below the threshold for statin recommendation, he has significant family history of heart disease. I reviewed the 2018 stress test he had done After discussing recommendations and ways to work on improving his risk factors, he is agreeable to working on exercise recommendations including 150 minutes of aerobic exercise weekly with a combination of moderate to intense exercise, counseled on low-carb diet cutting out grains and focusing more on vegetables and protein, limiting salt, setting personal goals for fitness and weight loss.  I gave him the option of continuing statin or not.  Advised that I could make a case either way for or against a statin given his information above  He will work on lifestyle changes and will return in  1 month for nurse visit to check blood pressure and weight   Aaron Patel was seen today for follow-up.  Diagnoses and all orders for this visit:  Hyperlipidemia, unspecified hyperlipidemia type  Family history of premature CAD  Class 1 obesity with serious comorbidity and body mass index (BMI) of 32.0 to 32.9 in adult, unspecified obesity type  Elevated systolic blood pressure reading without diagnosis of hypertension

## 2018-04-18 NOTE — Patient Instructions (Signed)
Lets change strategies and try something that may work better than what you are currently doing    I want you to eat 3 meals a day +2 snacks, one midmorning snack and one mid afternoon snack  Breakfast You may eat 1 of the following  Omelette, which can include a small amount of cheese, and vegetables such as peppers, mushrooms, small pieces of [place] or chicken  Low sugar yogurt serving which can include some fruit such as berries  Egg whites or hard boiled egg and meat (1-2 strips of bacon, or small piece of [place] sausage or [place] bacon)   Mid-morning snack 1 fruit serving such as one of the following:  medium-sized apple  medium-sized orange,  Tangerine  1/2 banana   3/4 cup of fresh berries or frozen berries  A protein source such as one of the following:  8 almonds   small handful of walnuts or other nuts  small piece of cheese,  low sugar yogurt   Lunch A protein source such as 1 of the following: . 1 serving of beans such as black beans, pinto beans, green beans, or edamame (soy beans) . 1 meat serving such as 6 oz or deck of card size serving of fish, skinless chicken, or [place], either grilled or baked preferably.   You can use some pork or beef, but limit this compared to fish, chicken or [place] Vegetable - Half of your plate should be a non-starchy vegetables!  So avoid white potatoes and corn.  Otherwise, eat a large portion of vegetables. . Avocado, cucumber, tomato, carrots, greens, lettuce, squash, okra, etc.  . Vegetables can include salad with olive oil/vinaigrette dressing   Mid-afternoon snack 1 fruit serving such as one of the following:  medium-sized apple  medium-sized orange,  Tangerine  1/2 banana   3/4 cup of fresh berries or frozen berries  A protein source such as one of the following:  8 almonds   small handful of walnuts or other nuts  small piece of cheese,  low sugar yogurt   Dinner A protein source such as 1  of the following: . 1 serving of beans such as black beans, pinto beans, green beans, or edamame (soy beans) . 1 meat serving such as 6 oz or deck of card size serving of fish, skinless chicken, or [place], either grilled or baked preferably.   You can use some pork or beef, but limit this compared to fish, chicken or [place] Vegetable - Half of your plate should be a non-starchy vegetables!  So avoid white potatoes and corn.  Otherwise, eat a large portion of vegetables. . Avocado, cucumber, tomato, carrots, greens, lettuce, squash, okra, etc.  . Vegetables can include salad with olive oil/vinaigrette dressing   Beverages: Water Unsweet tea Home made juice with a juicer without sugar added other than small bit of honey or agave nectar Water with sugar free flavor such as [place]   AVOID.... For the time being I want you to cut out the following items completely: . Soda, sweet tea, juice, beer or wine or alcohol . ALL grains and breads including rice, pasta, bread, cereal . Sweets such as cake, candy, pies, chips, cookies, chocolate

## 2018-04-21 ENCOUNTER — Emergency Department (HOSPITAL_COMMUNITY): Admission: EM | Admit: 2018-04-21 | Payer: BC Managed Care – PPO | Source: Home / Self Care

## 2018-05-18 ENCOUNTER — Ambulatory Visit: Payer: BC Managed Care – PPO | Admitting: Medical

## 2018-05-18 ENCOUNTER — Encounter: Payer: Self-pay | Admitting: Medical

## 2018-05-18 VITALS — BP 120/84 | HR 76 | Resp 16 | Ht 72.0 in | Wt 222.8 lb

## 2018-05-18 DIAGNOSIS — E669 Obesity, unspecified: Secondary | ICD-10-CM | POA: Diagnosis not present

## 2018-05-18 DIAGNOSIS — E785 Hyperlipidemia, unspecified: Secondary | ICD-10-CM | POA: Diagnosis not present

## 2018-05-18 DIAGNOSIS — Z8249 Family history of ischemic heart disease and other diseases of the circulatory system: Secondary | ICD-10-CM | POA: Diagnosis not present

## 2018-05-18 DIAGNOSIS — Z6832 Body mass index (BMI) 32.0-32.9, adult: Secondary | ICD-10-CM

## 2018-05-18 NOTE — Progress Notes (Signed)
Subjective: Chief Complaint  Patient presents with  . Hyperlipidemia   Here for follow-up from 04/18/18 visit.    At his last visit we discussed his medical problem list and risk factors for heart disease, obesity, hyperlipidemia, premature CAD.  However we also discussed his overall risk calculations.  We came to an agreement that he work on lifestyle changes and stated starting a statin to help with weight loss, lipids and    He has questions about need to continue statin, goals of therapy, alternate ways of treatment, possible side effects of statin.  Also here for recheck and elevated blood pressure from last visit.  Denies chest pain shortness of breath or decreased exercise tolerance.  No other complaint.  Past Medical History:  Diagnosis Date  . Chest pain    cath 12/31/10: post AV CFX < 10% (no significant CAD), EF 60%   . Family history of ischemic heart disease   . GERD (gastroesophageal reflux disease)    intermittent  . HLD (hyperlipidemia)   . Hx of cardiovascular stress test 11/2016   normal exercise stress test  . Hx of echocardiogram Aug 2012   Normal  . Lipoma    Dr. Greer Pickerel, consult 2013, pending surgery  . Obesity   . Peyronie disease    Urology consult prior   Family History  Problem Relation Age of Onset  . Asthma Mother   . Mental illness Mother   . Heart disease Mother 71       died of CHF  . Arthritis Father   . Diabetes Father   . Heart disease Father 38       heart transplant, died of heart disease 5 years later  . Hypertension Brother   . Arthritis Paternal Aunt   . Diabetes Paternal Aunt   . Arthritis Paternal Uncle   . Diabetes Paternal Uncle   . Clotting disorder Maternal Aunt   . Colon cancer Maternal Aunt   . Cancer Maternal Grandmother   . Stroke Neg Hx    ROS as in subjective    Objective: BP 120/84   Pulse 76   Resp 16   Ht 6' (1.829 m)   Wt 222 lb 12.8 oz (101.1 kg)   BMI 30.22 kg/m   Gen: wd, wn, nad Otherwise not  examined    Assessment: Encounter Diagnoses  Name Primary?  . Hyperlipidemia, unspecified hyperlipidemia type Yes  . Family history of heart disease in male family member before age 61   . Class 1 obesity with serious comorbidity and body mass index (BMI) of 32.0 to 32.9 in adult, unspecified obesity type      Plan: We reviewed his family history including premature family history of heart disease and both parents. We reviewed his American Heart Association cardiac risk calculator with a score of 6.3% chance of heart disease in the next 10 years We reviewed the Framingham risk calculator with a score below 5% risk in the next 10 years Although his risk calculator is just slightly below the threshold for statin recommendation, he has significant family history of heart disease. I reviewed the 2018 stress test he had done After discussing recommendations and ways to work on improving his risk factors, he is agreeable to working on exercise recommendations including 150 minutes of aerobic exercise weekly with a combination of moderate to intense exercise, counseled on low-carb diet cutting out grains and focusing more on vegetables and protein, limiting salt, setting personal goals for fitness and weight  loss.  I gave him the option of continuing statin or not.  Advised that I could make a case either way for or against a statin given his information above  He will work on lifestyle changes and will return in 1 month for nurse visit to check blood pressure and weight   Aaron Patel was seen today for hyperlipidemia.  Diagnoses and all orders for this visit:  Hyperlipidemia, unspecified hyperlipidemia type  Family history of heart disease in male family member before age 32  Class 1 obesity with serious comorbidity and body mass index (BMI) of 32.0 to 32.9 in adult, unspecified obesity type

## 2018-08-22 ENCOUNTER — Encounter: Payer: Self-pay | Admitting: Medical

## 2018-08-22 ENCOUNTER — Ambulatory Visit: Payer: BC Managed Care – PPO | Admitting: Medical

## 2018-08-24 ENCOUNTER — Ambulatory Visit: Payer: BC Managed Care – PPO | Admitting: Medical

## 2018-08-24 ENCOUNTER — Encounter: Payer: Self-pay | Admitting: Medical

## 2018-08-24 ENCOUNTER — Other Ambulatory Visit: Payer: Self-pay

## 2018-08-24 VITALS — BP 130/80 | HR 81 | Temp 98.1°F | Resp 16 | Ht 72.0 in | Wt 223.8 lb

## 2018-08-24 DIAGNOSIS — Z8249 Family history of ischemic heart disease and other diseases of the circulatory system: Secondary | ICD-10-CM

## 2018-08-24 DIAGNOSIS — N50811 Right testicular pain: Secondary | ICD-10-CM | POA: Diagnosis not present

## 2018-08-24 DIAGNOSIS — N509 Disorder of male genital organs, unspecified: Secondary | ICD-10-CM

## 2018-08-24 DIAGNOSIS — Z125 Encounter for screening for malignant neoplasm of prostate: Secondary | ICD-10-CM

## 2018-08-24 DIAGNOSIS — E785 Hyperlipidemia, unspecified: Secondary | ICD-10-CM | POA: Diagnosis not present

## 2018-08-24 DIAGNOSIS — M545 Low back pain: Secondary | ICD-10-CM

## 2018-08-24 DIAGNOSIS — G8929 Other chronic pain: Secondary | ICD-10-CM

## 2018-08-24 MED ORDER — SULFAMETHOXAZOLE-TRIMETHOPRIM 800-160 MG PO TABS: 1.0000 | ORAL_TABLET | Freq: Two times a day (BID) | ORAL | 0 refills | Status: DC

## 2018-08-24 NOTE — Progress Notes (Signed)
Subjective: Chief Complaint  Patient presents with  . follow up    3 month folllow up,    Here for 83mo f/u.     Here for recheck on meds and chronic issues.  He is taking his Mevacor for lipids since last visit.   Working on [employer] and exercise.    He reports some pains in back.   Has had issues with disc in back in the past.  Saw ortho back in November for this, Dr. Marlou Sa.   Has been doing some physical therapy as well.   Sees Dr. Marlou Sa again tomorrow.  Sometimes has pain in groin, thinks its related to disc issue in the back.  Sometimes wears a lumbar support that can help his groin pain.   Denies burning with urination, no urinary frequency, no urinary hesitancy, sometimes dribbling at the end of urination, no blood in urine, no cloudy urine.   Has discomfort in right testicle, no left or penis.   Feels a chronic "nodule" in right tube going to testicle.  No redness, no warmth in scrotal area.  Feels a scab on outside underneath of right testicle.   No recent injury to groin.   No penile discharge.   No fever, no nausea, vomiting.   Has some RLQ discomfort.   No concern for STD, no new partners.     No other c/o  Past Medical History:  Diagnosis Date  . Chest pain    cath 12/31/10: post AV CFX < 10% (no significant CAD), EF 60%   . Family history of ischemic heart disease   . GERD (gastroesophageal reflux disease)    intermittent  . HLD (hyperlipidemia)   . Hx of cardiovascular stress test 11/2016   normal exercise stress test  . Hx of echocardiogram Aug 2012   Normal  . Lipoma    Dr. Greer Pickerel, consult 2013, pending surgery  . Obesity   . Peyronie disease    Urology consult prior   Family History  Problem Relation Age of Onset  . Asthma Mother   . Mental illness Mother   . Heart disease Mother 35       died of CHF  . Arthritis Father   . Diabetes Father   . Heart disease Father 22       heart transplant, died of heart disease 5 years later  . Hypertension Brother    . Arthritis Paternal Aunt   . Diabetes Paternal Aunt   . Arthritis Paternal Uncle   . Diabetes Paternal Uncle   . Clotting disorder Maternal Aunt   . Colon cancer Maternal Aunt   . Cancer Maternal Grandmother   . Stroke Neg Hx    ROS as in subjective    Objective: BP 130/80   Pulse 81   Temp 98.1 F (36.7 C) (Oral)   Resp 16   Ht 6' (1.829 m)   Wt 223 lb 12.8 oz (101.5 kg)   SpO2 98%   BMI 30.35 kg/m   Wt Readings from Last 3 Encounters:  08/24/18 223 lb 12.8 oz (101.5 kg)  05/18/18 222 lb 12.8 oz (101.1 kg)  04/18/18 229 lb 6.4 oz (104.1 kg)   BP Readings from Last 3 Encounters:  08/24/18 130/80  05/18/18 120/84  04/18/18 (!) 142/80    Gen: wd, wn, nad Right testicle superior pole with small 2-60mm diameter lump, similar finding left testicle, mild tenderness at right superior pole of testicle at the lump, skin: numerous brownish  82mm raised lesions, unchanged per patient, chronic otherwise normal male GU, no hernia, no lymphadenopathy, no other mass Rectal - anus normal tone, no abnormality, prostate seems to be WNL, no mass    Assessment: Encounter Diagnoses  Name Primary?  . Testicular pain, right Yes  . Scrotal lesion   . Skin lesion of scrotum   . Hyperlipidemia, unspecified hyperlipidemia type   . Family history of heart disease in male family member before age 84   . Screening for prostate cancer   . Chronic bilateral low back pain, unspecified whether sciatica present      Plan: Begin bactrim for possible orchitis, referral to urology for lump on testicles, spermatocele vs other, skin lesions on scrotum, likely skin tags.  PSA screening today  Back pain - f/u with ortho as planned tomorrow  Aaron Patel was seen today for follow up.  Diagnoses and all orders for this visit:  Testicular pain, right -     Ambulatory referral to Urology -     PSA  Scrotal lesion  Skin lesion of scrotum  Hyperlipidemia, unspecified hyperlipidemia  type  Family history of heart disease in male family member before age 65  Screening for prostate cancer -     PSA  Chronic bilateral low back pain, unspecified whether sciatica present  Other orders -     sulfamethoxazole-trimethoprim (BACTRIM DS,SEPTRA DS) 800-160 MG tablet; Take 1 tablet by mouth 2 (two) times daily.

## 2018-08-25 ENCOUNTER — Ambulatory Visit (INDEPENDENT_AMBULATORY_CARE_PROVIDER_SITE_OTHER): Payer: BC Managed Care – PPO

## 2018-08-25 ENCOUNTER — Encounter (INDEPENDENT_AMBULATORY_CARE_PROVIDER_SITE_OTHER): Payer: Self-pay | Admitting: Orthopedic Surgery

## 2018-08-25 ENCOUNTER — Ambulatory Visit (INDEPENDENT_AMBULATORY_CARE_PROVIDER_SITE_OTHER): Payer: BC Managed Care – PPO | Admitting: Orthopedic Surgery

## 2018-08-25 DIAGNOSIS — M79604 Pain in right leg: Secondary | ICD-10-CM

## 2018-08-25 LAB — PSA: Prostate Specific Ag, Serum: 1.1 ng/mL (ref 0.0–4.0)

## 2018-08-25 NOTE — Progress Notes (Signed)
Office Visit Note   Patient: Aaron Patel.           Date of Birth: [birth year]           MRN: [id number] Visit Date: 08/25/2018 Requested by: Carlena Hurl, PA-C [address] [place], [place] [address] PCP: Carlena Hurl, PA-C  Subjective: Chief Complaint  Patient presents with  . Lower Back - Pain  . Right Hip - Pain    HPI: Patient presents with continued low back pain.  He actually saw his primary care physician yesterday with some testicle and lower abdominal pain.  He is being referred to urologist to evaluate for possible orchitis.  Ruled out for hernia by his primary care physician.  Is having some continued right-sided low back pain.  Has not had an MRI scan in over 10 years.  Never had epidural steroid injections.  The pain is not waking him from sleep at night.  Does not report any radicular pain.  He has been in physical therapy to work on stretching and strengthening and core strengthening for his back.              ROS: All systems reviewed are negative as they relate to the chief complaint within the history of present illness.  Patient denies  fevers or chills.   Assessment & Plan: Visit Diagnoses:  1. Pain in right leg     Plan: Impression is low back pain with current work-up for groin and testicle pain in progress for orchiditis.  I think that patient could benefit from MRI lumbar spine to evaluate right-sided radiculopathy.  Also has a component of SI joint sclerosis noted on plain radiographs.  This could be injected in the future if the back does not really show clear-cut evidence of right-sided radiculopathy.  His hip exam today is pretty benign so I think some of the early degenerative changes noted in the right hip are not necessarily clinically symptomatic at this time.  Follow-Up Instructions: Return for after MRI.   Orders:  Orders Placed This Encounter  Procedures  . XR Lumbar Spine 2-3 Views  . XR HIP UNILAT W OR W/O PELVIS 2-3 VIEWS  RIGHT  . MR Lumbar Spine w/o contrast   No orders of the defined types were placed in this encounter.     Procedures: No procedures performed   Clinical Data: No additional findings.  Objective: Vital Signs: There were no vitals taken for this visit.  Physical Exam:   Constitutional: Patient appears well-developed HEENT:  Head: Normocephalic Eyes:EOM are normal Neck: Normal range of motion Cardiovascular: Normal rate Pulmonary/chest: Effort normal Neurologic: Patient is alert Skin: Skin is warm Psychiatric: Patient has normal mood and affect    Ortho Exam: Ortho exam demonstrates normal gait alignment.  No real trochanteric tenderness on the right left-hand side.  No nerve root tension signs.  No paresthesias L1 S1 bilaterally.  Pedal pulses palpable.  No real groin pain or restricted range of motion right versus left hip.  Has excellent hip flexion abduction adduction strength.  No other masses lymphadenopathy or skin changes noted in the back region.  Specialty Comments:  No specialty comments available.  Imaging: Xr Hip Unilat W Or W/o Pelvis 2-3 Views Right  Result Date: 08/25/2018 AP pelvis lateral right hip reveals mild right-sided SI joint sclerosis.  In the right hip there is mild degenerative arthritis and joint space narrowing not present on the left-hand side.  No acute bony lesions or  fractures.  Xr Lumbar Spine 2-3 Views  Result Date: 08/25/2018 AP lateral lumbar spine reviewed.  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  L5-S1 degenerative disc disease is present.  No acute fracture or spinal listhesis.    PMFS History: Patient Active Problem List   Diagnosis Date Noted  . Skin lesion of scrotum 08/24/2018  . Scrotal lesion 08/24/2018  . Testicular pain, right 08/24/2018  . Chronic bilateral low back pain 08/24/2018  . Exposure to herpes 01/14/2018  . Screen for colon cancer 01/07/2017  . Screening for prostate cancer 01/07/2017  .  Family history of heart disease in male family member before age 45 12/24/2015  . Gastroesophageal reflux disease without esophagitis 12/24/2015  . Obesity 11/09/2014  . Lipoma of back 11/25/2011  . Peyronie's disease 05/04/2011  . HLD (hyperlipidemia)   . Chest pain    Past Medical History:  Diagnosis Date  . Chest pain    cath 12/31/10: post AV CFX < 10% (no significant CAD), EF 60%   . Family history of ischemic heart disease   . GERD (gastroesophageal reflux disease)    intermittent  . HLD (hyperlipidemia)   . Hx of cardiovascular stress test 11/2016   normal exercise stress test  . Hx of echocardiogram Aug 2012   Normal  . Lipoma    Dr. Greer Pickerel, consult 2013, pending surgery  . Obesity   . Peyronie disease    Urology consult prior    Family History  Problem Relation Age of Onset  . Asthma Mother   . Mental illness Mother   . Heart disease Mother 81       died of CHF  . Arthritis Father   . Diabetes Father   . Heart disease Father 54       heart transplant, died of heart disease 5 years later  . Hypertension Brother   . Arthritis Paternal Aunt   . Diabetes Paternal Aunt   . Arthritis Paternal Uncle   . Diabetes Paternal Uncle   . Clotting disorder Maternal Aunt   . Colon cancer Maternal Aunt   . Cancer Maternal Grandmother   . Stroke Neg Hx     Past Surgical History:  Procedure Laterality Date  . CARDIAC CATHETERIZATION  12/2010   Dr. Marigene Ehlers   . COLONOSCOPY  04/2017   normal, Dr. Zenovia Jarred  . LIPOMA EXCISION N/A 01/03/2016   Procedure: EXCISION LOWER BACK LIPOMA;  Surgeon: Greer Pickerel, MD;  Location: [facility];  Service: General;  Laterality: N/A;  . stye surgery     Social History   Occupational History  . 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 Smoking status: Former Smoker    Packs/day: 0.00    Years: 0.50    Pack years: 0.00    Last attempt to quit: 06/08/2006     Years since quitting: 12.2  . Smokeless tobacco: Never Used  . Tobacco comment: recreational smoking, not every day, quit in 2008  Substance and Sexual Activity  . Alcohol use: Yes    Alcohol/week: 1.0 standard drinks    Types: 1 Shots of liquor per week    Comment: occasionally  . Drug use: No  . Sexual activity: Yes

## 2018-08-29 ENCOUNTER — Telehealth: Payer: Self-pay | Admitting: Medical

## 2018-08-29 NOTE — Telephone Encounter (Signed)
Pt called to check on status of urology referral

## 2018-08-30 NOTE — Telephone Encounter (Signed)
Patient has appointment on September 13, 2018 at 215 with Dr Tresa Moore with [facility].   Patient notified of appointment.

## 2018-11-08 ENCOUNTER — Inpatient Hospital Stay: Admission: RE | Admit: 2018-11-08 | Payer: BC Managed Care – PPO | Source: Ambulatory Visit

## 2019-01-19 ENCOUNTER — Other Ambulatory Visit: Payer: Self-pay

## 2019-01-19 DIAGNOSIS — Z20822 Contact with and (suspected) exposure to covid-19: Secondary | ICD-10-CM

## 2019-01-21 LAB — NOVEL CORONAVIRUS, NAA: SARS-CoV-2, NAA: NOT DETECTED

## 2019-02-01 ENCOUNTER — Telehealth: Payer: Self-pay | Admitting: Orthopedic Surgery

## 2019-02-01 NOTE — Telephone Encounter (Signed)
Patient called stated that seen diagnosis on MY CHART and you wanted to know how to follow up with that,  Please call patient at [phone]

## 2019-02-02 NOTE — Telephone Encounter (Signed)
I s/w patient. He was stating he was noticing in chart mentioned that had DDD He said he was never told about this I made him an appt to discuss with Dr Marlou Sa.

## 2019-02-03 ENCOUNTER — Other Ambulatory Visit: Payer: Self-pay | Admitting: Medical

## 2019-02-03 DIAGNOSIS — E785 Hyperlipidemia, unspecified: Secondary | ICD-10-CM

## 2019-02-03 DIAGNOSIS — Z8249 Family history of ischemic heart disease and other diseases of the circulatory system: Secondary | ICD-10-CM

## 2019-02-15 ENCOUNTER — Ambulatory Visit (INDEPENDENT_AMBULATORY_CARE_PROVIDER_SITE_OTHER): Payer: BC Managed Care – PPO | Admitting: Orthopedic Surgery

## 2019-02-15 ENCOUNTER — Encounter: Payer: Self-pay | Admitting: Orthopedic Surgery

## 2019-02-15 ENCOUNTER — Other Ambulatory Visit: Payer: Self-pay

## 2019-02-15 DIAGNOSIS — M79604 Pain in right leg: Secondary | ICD-10-CM

## 2019-02-15 DIAGNOSIS — M5441 Lumbago with sciatica, right side: Secondary | ICD-10-CM | POA: Diagnosis not present

## 2019-02-15 NOTE — Progress Notes (Signed)
Office Visit Note   Patient: Aaron Patel.           Date of Birth: May 30, 1966           MRN: [id number] Visit Date: 02/15/2019 Requested by: Carlena Hurl, PA-C [address] [place],  [place] [address] PCP: Carlena Hurl, PA-C  Subjective: Chief Complaint  Patient presents with  . Lower Back - Pain    HPI: Canal is a patient with low back pain.  Reports continued pain.  Had MRI scan over 10 years ago with an injection that helped.  Is having some continued symptoms.  Using ibuprofen and CBD.  Wife recently lost her job.  Finances are tight.  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  It is bothering him some at work.              ROS: All systems reviewed are negative as they relate to the chief complaint within the history of present illness.  Patient denies  fevers or chills.   Assessment & Plan: Visit Diagnoses:  1. Pain in right leg   2. Acute bilateral low back pain with right-sided sciatica     Plan: Impression is low back pain which I think may be facet arthritis related potentially disc related.  He is failed conservative measures.  Needs MRI scan to get him ready for ESI x1 or 2 with Dr. Ernestina Patches.  I can potentially either call him or see him back after that study and get him set up with injections.  No red flag signs or symptoms today.  Follow-Up Instructions: Return for after MRI.   Orders:  Orders Placed This Encounter  Procedures  . MR Lumbar Spine w/o contrast   No orders of the defined types were placed in this encounter.     Procedures: No procedures performed   Clinical Data: No additional findings.  Objective: Vital Signs: There were no vitals taken for this visit.  Physical Exam:   Constitutional: Patient appears well-developed HEENT:  Head: Normocephalic Eyes:EOM are normal Neck: Normal range of motion Cardiovascular: Normal rate Pulmonary/chest: Effort normal Neurologic: Patient is alert Skin: Skin is  warm Psychiatric: Patient has normal mood and affect    Ortho Exam: Ortho exam demonstrates no nerve root tension signs with full ankle dorsiflexion plantarflexion strength quad hamstring strength no paresthesias L1 S1 bilaterally.  Pedal pulses palpable.  Does have more pain with extension as opposed to flexion.  No discrete tenderness in the trochanteric region  Specialty Comments:  No specialty comments available.  Imaging: No results found.   PMFS History: Patient Active Problem List   Diagnosis Date Noted  . Skin lesion of scrotum 08/24/2018  . Scrotal lesion 08/24/2018  . Testicular pain, right 08/24/2018  . Chronic bilateral low back pain 08/24/2018  . Exposure to herpes 01/14/2018  . Screen for colon cancer 01/07/2017  . Screening for prostate cancer 01/07/2017  . Family history of heart disease in male family member before age 48 12/24/2015  . Gastroesophageal reflux disease without esophagitis 12/24/2015  . Obesity 11/09/2014  . Lipoma of back 11/25/2011  . Peyronie's disease 05/04/2011  . HLD (hyperlipidemia)   . Chest pain    Past Medical History:  Diagnosis Date  . Chest pain    cath 12/31/10: post AV CFX < 10% (no significant CAD), EF 60%   . Family history of ischemic heart disease   . GERD (gastroesophageal reflux disease)    intermittent  .  HLD (hyperlipidemia)   . Hx of cardiovascular stress test 11/2016   normal exercise stress test  . Hx of echocardiogram Aug 2012   Normal  . Lipoma    Dr. Greer Pickerel, consult 2013, pending surgery  . Obesity   . Peyronie disease    Urology consult prior    Family History  Problem Relation Age of Onset  . Asthma Mother   . Mental illness Mother   . Heart disease Mother 55       died of CHF  . Arthritis Father   . Diabetes Father   . Heart disease Father 5       heart transplant, died of heart disease 5 years later  . Hypertension Brother   . Arthritis Paternal Aunt   . Diabetes Paternal Aunt   .  Arthritis Paternal Uncle   . Diabetes Paternal Uncle   . Clotting disorder Maternal Aunt   . Colon cancer Maternal Aunt   . Cancer Maternal Grandmother   . Stroke Neg Hx     Past Surgical History:  Procedure Laterality Date  . CARDIAC CATHETERIZATION  12/2010   Dr. Marigene Ehlers   . COLONOSCOPY  04/2017   normal, Dr. Zenovia Jarred  . LIPOMA EXCISION N/A 01/03/2016   Procedure: EXCISION LOWER BACK LIPOMA;  Surgeon: Greer Pickerel, MD;  Location: [facility];  Service: General;  Laterality: N/A;  . stye surgery     Social History   Occupational History  . 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 Smoking status: Former Smoker    Packs/day: 0.00    Years: 0.50    Pack years: 0.00    Quit date: 06/08/2006    Years since quitting: 12.6  . Smokeless tobacco: Never Used  . Tobacco comment: recreational smoking, not every day, quit in 2008  Substance and Sexual Activity  . Alcohol use: Yes    Alcohol/week: 1.0 standard drinks    Types: 1 Shots of liquor per week    Comment: occasionally  . Drug use: No  . Sexual activity: Yes

## 2019-02-20 ENCOUNTER — Other Ambulatory Visit: Payer: Self-pay

## 2019-02-20 ENCOUNTER — Encounter: Payer: Self-pay | Admitting: Medical

## 2019-02-20 ENCOUNTER — Ambulatory Visit: Payer: BC Managed Care – PPO | Admitting: Medical

## 2019-02-20 VITALS — BP 130/78 | HR 70 | Temp 98.7°F | Ht 72.0 in | Wt 229.8 lb

## 2019-02-20 DIAGNOSIS — Z2821 Immunization not carried out because of patient refusal: Secondary | ICD-10-CM | POA: Diagnosis not present

## 2019-02-20 DIAGNOSIS — L989 Disorder of the skin and subcutaneous tissue, unspecified: Secondary | ICD-10-CM | POA: Diagnosis not present

## 2019-02-20 DIAGNOSIS — S39012A Strain of muscle, fascia and tendon of lower back, initial encounter: Secondary | ICD-10-CM | POA: Insufficient documentation

## 2019-02-20 DIAGNOSIS — L608 Other nail disorders: Secondary | ICD-10-CM

## 2019-02-20 DIAGNOSIS — M25542 Pain in joints of left hand: Secondary | ICD-10-CM

## 2019-02-20 DIAGNOSIS — M25541 Pain in joints of right hand: Secondary | ICD-10-CM

## 2019-02-20 MED ORDER — METHOCARBAMOL 500 MG PO TABS: 500.0000 mg | ORAL_TABLET | Freq: Three times a day (TID) | ORAL | 0 refills | Status: DC | PRN

## 2019-02-20 NOTE — Progress Notes (Signed)
Subjective: Chief Complaint  Patient presents with  . Fall    fell in shower and hurt his lower back    Here for fall.  Fell in shower while on vacation yesterday.  Slipped and tweaked his back.  Caught himself on side of tub as he fell.  Twisted can caught himself but didn't hit back against an object.   After "tweaking" his back, twisting and contorting, had immediate pain.  Stood for a while longer before he moved.   Since then has pain ongoing.  Pain is in bilat low back.  No pain down legs, no numbness, no tingling in legs.  Drove back yesterday and had to adjust periodically in the car to help ease pain.  Was on vacation in .  Has used some ibuprofen for pain, 800mg  occasionally.  No fever, no incontinence, no blood in stool or urine.     He has a new skin lesion of the right temple times the last month and a half.  He has questions about nail deformity of fingernails.  He notes some occasional pain in both index finger.  No joint swelling.  No family history of rheumatoid disease.  No history of psoriasis.  No other aggravating or relieving factors. No other complaint.  Past Medical History:  Diagnosis Date  . Chest pain    cath 12/31/10: post AV CFX < 10% (no significant CAD), EF 60%   . Family history of ischemic heart disease   . GERD (gastroesophageal reflux disease)    intermittent  . HLD (hyperlipidemia)   . Hx of cardiovascular stress test 11/2016   normal exercise stress test  . Hx of echocardiogram Aug 2012   Normal  . Lipoma    Dr. Greer Pickerel, consult 2013, pending surgery  . Obesity   . Peyronie disease    Urology consult prior   Current Outpatient Medications on File Prior to Visit  Medication Sig Dispense Refill  . lovastatin (MEVACOR) 40 MG tablet TAKE 1 TABLET BY MOUTH AT BEDTIME 30 tablet 0   No current facility-administered medications on file prior to visit.    Past Surgical History:  Procedure Laterality Date  . CARDIAC CATHETERIZATION   12/2010   Dr. Marigene Ehlers   . COLONOSCOPY  04/2017   normal, Dr. Zenovia Jarred  . LIPOMA EXCISION N/A 01/03/2016   Procedure: EXCISION LOWER BACK LIPOMA;  Surgeon: Greer Pickerel, MD;  Location: [facility];  Service: General;  Laterality: N/A;  . stye surgery     Family History  Problem Relation Age of Onset  . Asthma Mother   . Mental illness Mother   . Heart disease Mother 82       died of CHF  . Arthritis Father   . Diabetes Father   . Heart disease Father 62       heart transplant, died of heart disease 5 years later  . Hypertension Brother   . Arthritis Paternal Aunt   . Diabetes Paternal Aunt   . Arthritis Paternal Uncle   . Diabetes Paternal Uncle   . Clotting disorder Maternal Aunt   . Colon cancer Maternal Aunt   . Cancer Maternal Grandmother   . Stroke Neg Hx      ROS as in subjective   Objective: BP 130/78   Pulse 70   Temp 98.7 F (37.1 C)   Ht 6' (1.829 m)   Wt 229 lb 12.8 oz (104.2 kg)   SpO2 97%   BMI 31.17 kg/m  Gen: wd, wn, nad Skin: right temple, 73mm diam, pink grayish raised amorphous lesion somewhat round but furrows, occasional pits noted in nails there is some minor bruise horizontally on some nails of the hands Tender bilateral lumbar spine, pain with range of motion which is about 80% of normal due to pain, no scoliosis no deformity Legs nontender, normal range of motion Legs neurovascularly intact  mild tenderness over bilateral second MCPs but with normal range of motion Arms and legs neurovascularly intact   Assessment: Encounter Diagnoses  Name Primary?  . Back strain, initial encounter Yes  . Influenza vaccination declined   . Skin lesion   . Nail deformity   . Arthralgia of both hands      Plan: Back strain-advised relative rest, stretching, can use heat or massage, continue ibuprofen over-the-counter, muscle relaxer as below short-term, no heavy lifting climbing or stooping for the next week.  Symptoms should gradually resolve over the  next 1.5 weeks  He declines flu shot today  New skin lesion right temple, given the quick onset, size and somewhat asymmetrical border, we will refer to dermatology  No deformity, arthralgia of both hands-next physical we will plan to check labs including uric acid.  There is no underlying rheumatoid disease or gout history.  No history of psoriasis.  He is not anemic.   Karlis was seen today for fall.  Diagnoses and all orders for this visit:  Back strain, initial encounter  Influenza vaccination declined  Skin lesion -     Ambulatory referral to Dermatology  Nail deformity  Arthralgia of both hands  Other orders -     methocarbamol (ROBAXIN) 500 MG tablet; Take 1 tablet (500 mg total) by mouth every 8 (eight) hours as needed for muscle spasms.

## 2019-02-25 ENCOUNTER — Ambulatory Visit
Admission: RE | Admit: 2019-02-25 | Discharge: 2019-02-25 | Disposition: A | Discharge status: Home / Self Care | Payer: BC Managed Care – PPO | Source: Ambulatory Visit | Attending: Orthopedic Surgery | Admitting: Orthopedic Surgery

## 2019-02-25 ENCOUNTER — Other Ambulatory Visit: Payer: Self-pay

## 2019-02-25 DIAGNOSIS — M5441 Lumbago with sciatica, right side: Secondary | ICD-10-CM

## 2019-02-25 DIAGNOSIS — M79604 Pain in right leg: Secondary | ICD-10-CM

## 2019-03-02 ENCOUNTER — Telehealth: Payer: Self-pay | Admitting: Orthopedic Surgery

## 2019-03-02 DIAGNOSIS — M5441 Lumbago with sciatica, right side: Secondary | ICD-10-CM

## 2019-03-02 NOTE — Telephone Encounter (Signed)
Patient called to let Dr. Marlou Sa know that he is having numbness in his arm when sleeping and also he is having pain in his left hip when he gets up.  CB#[phone].  Thank you.

## 2019-03-02 NOTE — Telephone Encounter (Signed)
See below. Please advise. Also, patients MRI was done on 09/15

## 2019-03-03 ENCOUNTER — Ambulatory Visit: Payer: BC Managed Care – PPO | Admitting: Medical

## 2019-03-03 ENCOUNTER — Encounter: Payer: Self-pay | Admitting: Medical

## 2019-03-03 ENCOUNTER — Other Ambulatory Visit: Payer: Self-pay

## 2019-03-03 VITALS — BP 120/80 | HR 77 | Temp 98.0°F | Ht 72.0 in | Wt 228.6 lb

## 2019-03-03 DIAGNOSIS — K219 Gastro-esophageal reflux disease without esophagitis: Secondary | ICD-10-CM

## 2019-03-03 DIAGNOSIS — M545 Low back pain, unspecified: Secondary | ICD-10-CM

## 2019-03-03 DIAGNOSIS — L608 Other nail disorders: Secondary | ICD-10-CM

## 2019-03-03 DIAGNOSIS — E669 Obesity, unspecified: Secondary | ICD-10-CM

## 2019-03-03 DIAGNOSIS — N486 Induration penis plastica: Secondary | ICD-10-CM

## 2019-03-03 DIAGNOSIS — M2550 Pain in unspecified joint: Secondary | ICD-10-CM | POA: Insufficient documentation

## 2019-03-03 DIAGNOSIS — Z8249 Family history of ischemic heart disease and other diseases of the circulatory system: Secondary | ICD-10-CM

## 2019-03-03 DIAGNOSIS — Z6832 Body mass index (BMI) 32.0-32.9, adult: Secondary | ICD-10-CM

## 2019-03-03 DIAGNOSIS — E785 Hyperlipidemia, unspecified: Secondary | ICD-10-CM

## 2019-03-03 DIAGNOSIS — Z0000 Encounter for general adult medical examination without abnormal findings: Secondary | ICD-10-CM | POA: Insufficient documentation

## 2019-03-03 DIAGNOSIS — D171 Benign lipomatous neoplasm of skin and subcutaneous tissue of trunk: Secondary | ICD-10-CM

## 2019-03-03 DIAGNOSIS — G8929 Other chronic pain: Secondary | ICD-10-CM

## 2019-03-03 NOTE — Telephone Encounter (Signed)
I called him and discussed his findings.  The right arm numbness I do not really have a great handle on but discussed with him different possibilities.  If that gets worse I told him to come in and we can see him for that.  As far as his back goes I think it still hurting him.  I think he may be having some symptomatic right hip arthritis also.  For now I think it would be good for Dr. Ernestina Patches to try inject his back as that has helped him in the past.  Please refer him for that injection thanks

## 2019-03-03 NOTE — Progress Notes (Signed)
Subjective:   HPI  Aaron Patel. is a 53 y.o. male who presents for Chief Complaint  Patient presents with  . Annual Exam    with fasting labs    Medical team: NP Truitt Merle, cardiology Podiatry Sees eye doctor and dentist Dr. Zenovia Jarred, GI Renell Allum, Camelia Eng, PA-C and Dorothea Ogle, PA-C here  Concerns: None today, feeling fine.  Skin lesion on right face is improving after using OTC wart remover.  Reviewed their medical, surgical, family, social, medication, and allergy history and updated chart as appropriate.  Past Medical History:  Diagnosis Date  . Chest pain    cath 12/31/10: post AV CFX < 10% (no significant CAD), EF 60%   . Family history of ischemic heart disease   . GERD (gastroesophageal reflux disease)    intermittent  . HLD (hyperlipidemia)   . Hx of cardiovascular stress test 11/2016   normal exercise stress test  . Hx of echocardiogram Aug 2012   Normal  . Lipoma    Dr. Greer Pickerel, consult 2013, pending surgery  . Obesity   . Peyronie disease    Urology consult prior    Past Surgical History:  Procedure Laterality Date  . CARDIAC CATHETERIZATION  12/2010   Dr. Marigene Ehlers   . COLONOSCOPY  04/2017   normal, Dr. Zenovia Jarred  . LIPOMA EXCISION N/A 01/03/2016   Procedure: EXCISION LOWER BACK LIPOMA;  Surgeon: Greer Pickerel, MD;  Location: [facility];  Service: General;  Laterality: N/A;  . stye surgery      Social History   Socioeconomic History  . Marital status: Married    Spouse name: Not on file  . Number of children: Not on file  . Years of education: Not on file  . Highest education level: Not on file  Occupational History  . 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 Financial resource strain: Not on file  . Food insecurity    Worry: Not on file    Inability: Not on file  . Transportation needs    Medical: Not on file    Non-medical: Not on file  Tobacco  Use  . Smoking status: Former Smoker    Packs/day: 0.00    Years: 0.50    Pack years: 0.00    Quit date: 06/08/2006    Years since quitting: 12.7  . Smokeless tobacco: Never Used  . Tobacco comment: recreational smoking, not every day, quit in 2008  Substance and Sexual Activity  . Alcohol use: Yes    Alcohol/week: 1.0 standard drinks    Types: 1 Shots of liquor per week    Comment: occasionally  . Drug use: No  . Sexual activity: Yes  Lifestyle  . Physical activity    Days per week: Not on file    Minutes per session: Not on file  . Stress: Not on file  Relationships  . Social Herbalist on phone: Not on file    Gets together: Not on file    Attends religious service: Not on file    Active member of club or organization: Not on file    Attends meetings of clubs or organizations: Not on file    Relationship status: Not on file  . Intimate partner violence    Fear of current or ex partner: Not on file    Emotionally abused: Not on file  Physically abused: Not on file    Forced sexual activity: Not on file  Other Topics Concern  . Not on file  Social History Narrative   Basketball, coaches basketball, married, 2 children both teenagers.  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  Exercise - officiate basketball,running up and down the court.  [employer].  02/2019    Family History  Problem Relation Age of Onset  . Asthma Mother   . Mental illness Mother   . Heart disease Mother 22       died of CHF  . Arthritis Father   . Diabetes Father   . Heart disease Father 28       heart transplant, died of heart disease 5 years later  . Hypertension Brother   . Arthritis Paternal Aunt   . Diabetes Paternal Aunt   . Arthritis Paternal Uncle   . Diabetes Paternal Uncle   . Clotting disorder Maternal Aunt   . Colon cancer Maternal Aunt   . Cancer Maternal Grandmother   . Stroke Neg Hx      Current Outpatient Medications:  .  lovastatin (MEVACOR) 40 MG tablet, TAKE 1 TABLET BY  MOUTH AT BEDTIME, Disp: 30 tablet, Rfl: 0 .  methocarbamol (ROBAXIN) 500 MG tablet, Take 1 tablet (500 mg total) by mouth every 8 (eight) hours as needed for muscle spasms., Disp: 12 tablet, Rfl: 0  Allergies  Allergen Reactions  . Iodinated Diagnostic Agents Hives and Rash     Review of Systems Constitutional: -fever, -chills, -sweats, -unexpected weight change, -decreased appetite, -fatigue Allergy: -sneezing, -itching, -congestion Dermatology: -changing moles, --rash, -lumps ENT: -runny nose, -ear pain, -sore throat, -hoarseness, -sinus pain, -teeth pain, - ringing in ears, -hearing loss, -nosebleeds Cardiology: -chest pain, -palpitations, -swelling, -difficulty breathing when lying flat, -waking up short of breath Respiratory: -cough, -shortness of breath, -difficulty breathing with exercise or exertion, -wheezing, -coughing up blood Gastroenterology: -abdominal pain, -nausea, -vomiting, -diarrhea, -constipation, -blood in stool, -changes in bowel movement, -difficulty swallowing or eating Hematology: -bleeding, -bruising  Musculoskeletal: +joint aches, -muscle aches, -joint swelling, -back pain, -neck pain, -cramping, -changes in gait Ophthalmology: denies vision changes, eye redness, itching, discharge Urology: -burning with urination, -difficulty urinating, -blood in urine, -urinary frequency, -urgency, -incontinence Neurology: -headache, -weakness, -tingling, -numbness, -memory loss, -falls, -dizziness Psychology: -depressed mood, -agitation, -sleep problems     Objective:   BP 120/80   Pulse 77   Temp 98 F (36.7 C)   Ht 6' (1.829 m)   Wt 228 lb 9.6 oz (103.7 kg)   SpO2 97%   BMI 31.00 kg/m    Wt Readings from Last 3 Encounters:  03/03/19 228 lb 9.6 oz (103.7 kg)  02/20/19 229 lb 12.8 oz (104.2 kg)  08/24/18 223 lb 12.8 oz (101.5 kg)    General appearance: alert, no distress, WD/WN, African American male Skin: tattoo brand scar on left upper arm, chinese  characters/words tattoo on right upper arm, few scattered benign lesions HEENT: normocephalic, conjunctiva/corneas normal, sclerae anicteric, PERRLA, EOMi, nares patent, no discharge or erythema, pharynx normal Oral cavity: MMM, tongue normal, teeth normal Neck: supple, no lymphadenopathy, no thyromegaly, no masses, normal ROM, no bruits Chest: non tender, normal shape and expansion Heart: RRR, normal S1, S2, no murmurs Lungs: CTA bilaterally, no wheezes, rhonchi, or rales Abdomen: +bs, soft, non tender, non distended, no masses, no hepatomegaly, no splenomegaly, no bruits Back: lower back surgical scar, otherwise non tender, normal ROM, no scoliosis Musculoskeletal: upper extremities non tender, no obvious deformity, normal ROM  throughout, lower extremities non tender, no obvious deformity, normal ROM throughout Extremities: no edema, no cyanosis, no clubbing Pulses: 2+ symmetric, upper and lower extremities, normal cap refill Neurological: alert, oriented x 3, CN2-12 intact, strength normal upper extremities and lower extremities, sensation normal throughout, DTRs 2+ throughout, no cerebellar signs, gait normal Psychiatric: normal affect, behavior normal, pleasant  GU: normal male external genitalia,circumcised, nontender, no masses, no hernia, no lymphadenopathy Rectal: deferred   Assessment and Plan :    Encounter Diagnoses  Name Primary?  . Encounter for health maintenance examination in adult   . Gastroesophageal reflux disease without esophagitis Yes  . Nail deformity   . Chronic bilateral low back pain, unspecified whether sciatica present   . Family history of heart disease in male family member before age 38   . Class 1 obesity with serious comorbidity and body mass index (BMI) of 32.0 to 32.9 in adult, unspecified obesity type   . Lipoma of back   . Hyperlipidemia, unspecified hyperlipidemia type   . Polyarthralgia   . Peyronie's disease     Physical exam - discussed and  counseled on healthy lifestyle, diet, exercise, preventative care, vaccinations, sick and well care, proper use of emergency dept and after hours care, and addressed their concerns.    Health screening: See your eye doctor yearly for routine vision care. See your dentist yearly for routine dental care including hygiene visits twice yearly.  I reviewed his 11/2016 cardiac stress test that was normal I reviewed his 04/2017 colonoscopy that was normal I reviewed his 2018 PSA  Vaccinations: Counseled on the following vaccines:  Influenza, he declines  Shingles Shingles vaccine:  I recommend you have a shingles vaccine to help prevent shingles or herpes zoster outbreak.   Please call your insurer to inquire about coverage for the Shingrix vaccine given in 2 doses.   Some insurers cover this vaccine after age 58, some cover this after age 10.  If your insurer covers this, then call to schedule appointment to have this vaccine here.  Rheumatoid screen labs today as discussed last visit.     Sutter was seen today for annual exam.  Diagnoses and all orders for this visit:  Gastroesophageal reflux disease without esophagitis  Encounter for health maintenance examination in adult -     Comprehensive metabolic panel -     CBC with Differential/Platelet -     Lipid panel -     CYCLIC CITRUL PEPTIDE ANTIBODY, IGG/IGA -     Rheumatoid factor -     Sedimentation rate -     Uric acid  Nail deformity  Chronic bilateral low back pain, unspecified whether sciatica present  Family history of heart disease in male family member before age 43  Class 1 obesity with serious comorbidity and body mass index (BMI) of 32.0 to 32.9 in adult, unspecified obesity type  Lipoma of back  Hyperlipidemia, unspecified hyperlipidemia type -     Comprehensive metabolic panel -     Lipid panel  Polyarthralgia -     CYCLIC CITRUL PEPTIDE ANTIBODY, IGG/IGA -     Rheumatoid factor -     Sedimentation rate -      Uric acid  Peyronie's disease   Follow-up pending labs, yearly for physical

## 2019-03-05 LAB — CBC WITH DIFFERENTIAL/PLATELET
Basophils Absolute: 0.1 10*3/uL (ref 0.0–0.2)
Basos: 1 %
EOS (ABSOLUTE): 0.1 10*3/uL (ref 0.0–0.4)
Eos: 2 %
Hematocrit: 46 % (ref 37.5–51.0)
Hemoglobin: 15.5 g/dL (ref 13.0–17.7)
Immature Grans (Abs): 0 10*3/uL (ref 0.0–0.1)
Immature Granulocytes: 0 %
Lymphocytes Absolute: 1.8 10*3/uL (ref 0.7–3.1)
Lymphs: 35 %
MCH: 29.9 pg (ref 26.6–33.0)
MCHC: 33.7 g/dL (ref 31.5–35.7)
MCV: 89 fL (ref 79–97)
Monocytes Absolute: 0.5 10*3/uL (ref 0.1–0.9)
Monocytes: 9 %
Neutrophils Absolute: 2.8 10*3/uL (ref 1.4–7.0)
Neutrophils: 53 %
Platelets: 314 10*3/uL (ref 150–450)
RBC: 5.18 x10E6/uL (ref 4.14–5.80)
RDW: 12.7 % (ref 11.6–15.4)
WBC: 5.2 10*3/uL (ref 3.4–10.8)

## 2019-03-05 LAB — CYCLIC CITRUL PEPTIDE ANTIBODY, IGG/IGA: Cyclic Citrullin Peptide Ab: 5 units (ref 0–19)

## 2019-03-05 LAB — COMPREHENSIVE METABOLIC PANEL
ALT: 20 IU/L (ref 0–44)
AST: 16 IU/L (ref 0–40)
Albumin/Globulin Ratio: 1.7 (ref 1.2–2.2)
Albumin: 4.4 g/dL (ref 3.8–4.9)
Alkaline Phosphatase: 110 IU/L (ref 39–117)
BUN/Creatinine Ratio: 11 (ref 9–20)
BUN: 12 mg/dL (ref 6–24)
Bilirubin Total: 0.6 mg/dL (ref 0.0–1.2)
CO2: 26 mmol/L (ref 20–29)
Calcium: 9.2 mg/dL (ref 8.7–10.2)
Chloride: 103 mmol/L (ref 96–106)
Creatinine, Ser: 1.07 mg/dL (ref 0.76–1.27)
GFR calc Af Amer: 92 mL/min/{1.73_m2} (ref >59)
GFR calc non Af Amer: 79 mL/min/{1.73_m2} (ref >59)
Globulin, Total: 2.6 g/dL (ref 1.5–4.5)
Glucose: 92 mg/dL (ref 65–99)
Potassium: 4.3 mmol/L (ref 3.5–5.2)
Sodium: 140 mmol/L (ref 134–144)
Total Protein: 7 g/dL (ref 6.0–8.5)

## 2019-03-05 LAB — LIPID PANEL
Chol/HDL Ratio: 3.4 ratio (ref 0.0–5.0)
Cholesterol, Total: 168 mg/dL (ref 100–199)
HDL: 49 mg/dL (ref >39)
LDL Chol Calc (NIH): 102 mg/dL — ABNORMAL HIGH (ref 0–99)
Triglycerides: 94 mg/dL (ref 0–149)
VLDL Cholesterol Cal: 17 mg/dL (ref 5–40)

## 2019-03-05 LAB — RHEUMATOID FACTOR: Rheumatoid fact SerPl-aCnc: 10.4 IU/mL (ref 0.0–13.9)

## 2019-03-05 LAB — SEDIMENTATION RATE: Sed Rate: 17 mm/hr (ref 0–30)

## 2019-03-05 LAB — URIC ACID: Uric Acid: 6.2 mg/dL (ref 3.7–8.6)

## 2019-03-06 ENCOUNTER — Other Ambulatory Visit: Payer: Self-pay | Admitting: Medical

## 2019-03-06 DIAGNOSIS — E785 Hyperlipidemia, unspecified: Secondary | ICD-10-CM

## 2019-03-06 DIAGNOSIS — Z8249 Family history of ischemic heart disease and other diseases of the circulatory system: Secondary | ICD-10-CM

## 2019-03-06 MED ORDER — LOVASTATIN 40 MG PO TABS: 40.0000 mg | ORAL_TABLET | Freq: Every day | ORAL | 3 refills | Status: DC

## 2019-03-13 ENCOUNTER — Ambulatory Visit: Payer: BC Managed Care – PPO | Admitting: Orthopedic Surgery

## 2019-03-28 ENCOUNTER — Encounter: Payer: Self-pay | Admitting: Physical Medicine and Rehabilitation

## 2019-03-28 ENCOUNTER — Other Ambulatory Visit: Payer: Self-pay | Admitting: Registered"

## 2019-03-28 ENCOUNTER — Ambulatory Visit (INDEPENDENT_AMBULATORY_CARE_PROVIDER_SITE_OTHER): Payer: BC Managed Care – PPO | Admitting: Physical Medicine and Rehabilitation

## 2019-03-28 ENCOUNTER — Ambulatory Visit: Payer: Self-pay

## 2019-03-28 VITALS — BP 146/92 | HR 87

## 2019-03-28 DIAGNOSIS — Z20822 Contact with and (suspected) exposure to covid-19: Secondary | ICD-10-CM

## 2019-03-28 DIAGNOSIS — M5116 Intervertebral disc disorders with radiculopathy, lumbar region: Secondary | ICD-10-CM | POA: Diagnosis not present

## 2019-03-28 DIAGNOSIS — M5416 Radiculopathy, lumbar region: Secondary | ICD-10-CM

## 2019-03-28 MED ORDER — METHYLPREDNISOLONE ACETATE 80 MG/ML IJ SUSP
80.0000 mg | Freq: Once | INTRAMUSCULAR | Status: AC
  Administered 2019-03-28: 80 mg

## 2019-03-28 NOTE — Progress Notes (Signed)
 .  Numeric Pain Rating Scale and Functional Assessment Average Pain 8   In the last MONTH (on 0-10 scale) has pain interfered with the following?  1. General activity like being  able to carry out your everyday physical activities such as walking, climbing stairs, carrying groceries, or moving a chair?  Rating(8)   +Driver, -BT, -Dye Allergies.  

## 2019-03-30 ENCOUNTER — Telehealth: Payer: Self-pay

## 2019-03-30 LAB — NOVEL CORONAVIRUS, NAA: SARS-CoV-2, NAA: DETECTED — AB

## 2019-03-30 NOTE — Telephone Encounter (Signed)
Patient was informed. He will call if he start having symptoms as he does not want an appointment at this time.

## 2019-03-30 NOTE — Telephone Encounter (Signed)
Pt. Called stating he wanted to talk to [facility] or his nurse about recent COVID test result he just saw on mychart. It said under value it was detected and under standard range it said not detected, per pt. Doesn't need an appt. Would just like to discuss COVID results with Audelia Acton or his nurse and protocol for what he should do next.

## 2019-03-30 NOTE — Telephone Encounter (Signed)
I have left for today.    It was detected as positive.  The "not detected" just means reference ranges or the opposite of detected.  In his case, he was in fact positive.     If he is having symptoms, recommend tele visit.   If no symptoms, he should quarantine at home away from others 10 days as symptoms could start within 1-10 days or he could be a carrier and spread to others in this time frame.   If he develops symptoms in that time frame, then call back.

## 2019-04-17 ENCOUNTER — Other Ambulatory Visit: Payer: Self-pay

## 2019-04-17 ENCOUNTER — Encounter: Payer: Self-pay | Admitting: Medical

## 2019-04-17 ENCOUNTER — Ambulatory Visit: Payer: BC Managed Care – PPO | Admitting: Medical

## 2019-04-17 VITALS — BP 136/80 | HR 83 | Temp 98.0°F | Ht 72.0 in | Wt 230.6 lb

## 2019-04-17 DIAGNOSIS — U071 COVID-19: Secondary | ICD-10-CM | POA: Diagnosis not present

## 2019-04-17 DIAGNOSIS — Z6832 Body mass index (BMI) 32.0-32.9, adult: Secondary | ICD-10-CM

## 2019-04-17 DIAGNOSIS — E669 Obesity, unspecified: Secondary | ICD-10-CM | POA: Diagnosis not present

## 2019-04-17 DIAGNOSIS — R030 Elevated blood-pressure reading, without diagnosis of hypertension: Secondary | ICD-10-CM | POA: Diagnosis not present

## 2019-04-17 NOTE — Progress Notes (Signed)
Subjective: Chief Complaint  Patient presents with  . Follow-up    follow up on Private school    Here for concerns about positive covid test.   He was exposed to covid inadvertently by a student 3-4 weeks ago.   He and his family got tested.  He was positive although his daughter and wife were negative.  He and none of his household ever got sick.   They quarantined nonetheless.  Now 3 weeks have went by.   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   College is asking for covid tests prior to each game.  He needs advise on next steps and whether he needs testing.     He feels fine.  He has continued to exercise regularly without chest pain, sob, dyspnea.     No prior BP medication although BP elevated today.  He checks BP from time to time and home BPs are normal.    Past Medical History:  Diagnosis Date  . Chest pain    cath 12/31/10: post AV CFX < 10% (no significant CAD), EF 60%   . Family history of ischemic heart disease   . GERD (gastroesophageal reflux disease)    intermittent  . HLD (hyperlipidemia)   . Hx of cardiovascular stress test 11/2016   normal exercise stress test  . Hx of echocardiogram Aug 2012   Normal  . Lipoma    Dr. Greer Pickerel, consult 2013, pending surgery  . Obesity   . Peyronie disease    Urology consult prior   Family History  Problem Relation Age of Onset  . Asthma Mother   . Mental illness Mother   . Heart disease Mother 46       died of CHF  . Arthritis Father   . Diabetes Father   . Heart disease Father 31       heart transplant, died of heart disease 5 years later  . Hypertension Brother   . Arthritis Paternal Aunt   . Diabetes Paternal Aunt   . Arthritis Paternal Uncle   . Diabetes Paternal Uncle   . Clotting disorder Maternal Aunt   . Colon cancer Maternal Aunt   . Cancer Maternal Grandmother   . Stroke Neg Hx    ROS as in subjective   Objective: BP 136/80   Pulse 83   Temp 98 F (36.7 C)   Ht 6' (1.829  m)   Wt 230 lb 9.6 oz (104.6 kg)   SpO2 97%   BMI 31.27 kg/m   BP Readings from Last 3 Encounters:  04/17/19 136/80  03/28/19 (!) 146/92  03/03/19 120/80   Wt Readings from Last 3 Encounters:  04/17/19 230 lb 9.6 oz (104.6 kg)  03/03/19 228 lb 9.6 oz (103.7 kg)  02/20/19 229 lb 12.8 oz (104.2 kg)   General appearance: alert, no distress, WD/WN,  HEENT: normocephalic, sclerae anicteric, TMs pearly, nares patent, no discharge or erythema, pharynx normal Oral cavity: MMM, no lesions Neck: supple, no lymphadenopathy, no thyromegaly, no masses Heart: RRR, normal S1, S2, no murmurs Lungs: CTA bilaterally, no wheezes, rhonchi, or rales Pulses: 2+ symmetric, upper and lower extremities, normal cap refill Ext: no edema   Assessment: Encounter Diagnoses  Name Primary?  . Elevated blood-pressure reading without diagnosis of hypertension Yes  . Lab test positive for detection of COVID-19 virus   . Class 1 obesity with serious comorbidity and body mass index (BMI) of 32.0 to 32.9 in adult, unspecified obesity type  Plan: Elevated BP - we discussed risks of uncontrolled BP.   He declines medication, wants to work on lifesyle measures.   In the past year, he has had a variety of normal and borderline BP readings.   Home readings recently less than threshold for hypertension diagnosis.   Advised he get me BP readings in 1-2 months.  He will c/t efforts to lose weight through lifestyle changes  covid + test, asymptomatic.   discussed case with Dr. Redmond School who also came into the room to discuss.  Per CDC guidelines, given his asymptomatic positive situation, he has already gone through appropriate quarantine period and does not need retesting.  We will write a letter on his behalf.  He has had no worrisome symptoms warranting other testing such as echocardiogram or imaging.   He has gradually resumed activity and feels fine.   We will write him a letter to take to his games.     Yamin  was seen today for follow-up.  Diagnoses and all orders for this visit:  Elevated blood-pressure reading without diagnosis of hypertension  Lab test positive for detection of COVID-19 virus  Class 1 obesity with serious comorbidity and body mass index (BMI) of 32.0 to 32.9 in adult, unspecified obesity type

## 2019-04-21 ENCOUNTER — Encounter: Payer: Self-pay | Admitting: Medical

## 2019-04-27 NOTE — Procedures (Signed)
Lumbar Epidural Steroid Injection - Interlaminar Approach with Fluoroscopic Guidance  Patient: Aaron Patel.      Date of Birth: 1965/10/25 MRN: [id number] PCP: Caryl Ada      Visit Date: 03/28/2019   Universal Protocol:     Consent Given By: the patient  Position: PRONE  Additional Comments: Vital signs were monitored before and after the procedure. Patient was prepped and draped in the usual sterile fashion. The correct patient, procedure, and site was verified.   Injection Procedure Details:  Procedure Site One Meds Administered:  Meds ordered this encounter  Medications  . methylPREDNISolone acetate (DEPO-MEDROL) injection 80 mg     Laterality: Right  Location/Site:  L5-S1  Needle size: 20 G  Needle type: Tuohy  Needle Placement: Paramedian epidural  Findings:   -Comments: Excellent biplanar positioning of the needle in the epidural space with excellent loss of resistance.  Contrast not utilized.  Patient having contrast allergy.  Procedure Details: Using a paramedian approach from the side mentioned above, the region overlying the inferior lamina was localized under fluoroscopic visualization and the soft tissues overlying this structure were infiltrated with 4 ml. of 1% Lidocaine without Epinephrine. The Tuohy needle was inserted into the epidural space using a paramedian approach.   The epidural space was localized using loss of resistance along with lateral and bi-planar fluoroscopic views.  After negative aspirate for air, blood, and CSF, a 2 ml. volume of Isovue-250 was injected into the epidural space and the flow of contrast was observed. Radiographs were obtained for documentation purposes.    The injectate was administered into the level noted above.   Additional Comments:  The patient tolerated the procedure well Dressing: 2 x 2 sterile gauze and Band-Aid    Post-procedure details: Patient was observed during the  procedure. Post-procedure instructions were reviewed.  Patient left the clinic in stable condition.

## 2019-04-27 NOTE — Progress Notes (Signed)
Aaron Patel. - 53 y.o. male MRN [id number]  Date of birth: [birth year]  Office Visit Note: Visit Date: 03/28/2019 PCP: Carlena Hurl, PA-C Referred by: Carlena Hurl, PA-C  Subjective: Chief Complaint  Patient presents with  . Lower Back - Pain   HPI:  Aaron Ferring. is a 53 y.o. male who comes in today For planned right L5-S1 interlaminar steroid injection at the request of Dr. Anderson Malta.  Patient is having chronic worsening severe low back pain a little bit more right than left but somewhat equal.  No real radicular pain or numbness or tingling.  Worse with going from sit to stand and prolonged sitting.  MRI shows disc herniation at L5-S1.  No focal nerve compression very mild spondylitic arthropathy.  Depending on results would look at diagnostic medial branch blocks of the facets.  ROS Otherwise per HPI.  Assessment & Plan: Visit Diagnoses:  1. Radiculopathy due to lumbar intervertebral disc disorder   2. Lumbar radiculopathy     Plan: No additional findings.   Meds & Orders:  Meds ordered this encounter  Medications  . methylPREDNISolone acetate (DEPO-MEDROL) injection 80 mg    Orders Placed This Encounter  Procedures  . XR C-ARM NO REPORT  . Epidural Steroid injection    Follow-up: No follow-ups on file.   Procedures: No procedures performed  Lumbar Epidural Steroid Injection - Interlaminar Approach with Fluoroscopic Guidance  Patient: Aaron Patel.      Date of Birth: Jul 06, 1965 MRN: [id number] PCP: Caryl Ada      Visit Date: 03/28/2019   Universal Protocol:     Consent Given By: the patient  Position: PRONE  Additional Comments: Vital signs were monitored before and after the procedure. Patient was prepped and draped in the usual sterile fashion. The correct patient, procedure, and site was verified.   Injection Procedure Details:  Procedure Site One Meds Administered:  Meds ordered this encounter   Medications  . methylPREDNISolone acetate (DEPO-MEDROL) injection 80 mg     Laterality: Right  Location/Site:  L5-S1  Needle size: 20 G  Needle type: Tuohy  Needle Placement: Paramedian epidural  Findings:   -Comments: Excellent biplanar positioning of the needle in the epidural space with excellent loss of resistance.  Contrast not utilized.  Patient having contrast allergy.  Procedure Details: Using a paramedian approach from the side mentioned above, the region overlying the inferior lamina was localized under fluoroscopic visualization and the soft tissues overlying this structure were infiltrated with 4 ml. of 1% Lidocaine without Epinephrine. The Tuohy needle was inserted into the epidural space using a paramedian approach.   The epidural space was localized using loss of resistance along with lateral and bi-planar fluoroscopic views.  After negative aspirate for air, blood, and CSF, a 2 ml. volume of Isovue-250 was injected into the epidural space and the flow of contrast was observed. Radiographs were obtained for documentation purposes.    The injectate was administered into the level noted above.   Additional Comments:  The patient tolerated the procedure well Dressing: 2 x 2 sterile gauze and Band-Aid    Post-procedure details: Patient was observed during the procedure. Post-procedure instructions were reviewed.  Patient left the clinic in stable condition.   Clinical History: MRI LUMBAR SPINE WITHOUT CONTRAST  TECHNIQUE: Multiplanar, multisequence MR imaging of the lumbar spine was performed. No intravenous contrast was administered.  COMPARISON:  None.  FINDINGS: Segmentation:  Standard.  Alignment:  2 mm retrolisthesis of L2 on L3 and L3 on L4.  Vertebrae:  No fracture, evidence of discitis, or bone lesion.  Conus medullaris and cauda equina: Conus extends to the T12 level. Conus and cauda equina appear normal.  Paraspinal and other soft  tissues: No acute paraspinal abnormality.  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  Disc levels:  Disc spaces: Degenerative disease with disc height loss at L2-3, L3-4, L4-5 and L5-S1.  T12-L1: Mild broad-based disc bulge. No evidence of neural foraminal stenosis. No central canal stenosis.  L1-L2: No significant disc bulge. No evidence of neural foraminal stenosis. No central canal stenosis.  L2-L3: Broad-based disc bulge. Mild bilateral facet arthropathy. Mild right foraminal narrowing. No left foraminal narrowing. No central canal stenosis.  L3-L4: Mild broad-based disc bulge. Mild bilateral facet arthropathy. Mild bilateral foraminal stenosis. No central canal stenosis.  L4-L5: Broad-based disc bulge with a broad central disc protrusion. Mild bilateral facet arthropathy. Mild bilateral foraminal narrowing. No central canal stenosis.  L5-S1: Broad-based disc bulge with a right paracentral broad disc protrusion. Mild bilateral facet arthropathy. Mild left foraminal stenosis. No right foraminal stenosis. No central canal stenosis.  IMPRESSION: 1. Diffuse lumbar spine spondylosis as described above. 2.  No acute osseous injury of the lumbar spine.   Electronically Signed   By: Kathreen Devoid   On: 02/25/2019 13:18     Objective:  VS:  HT:    WT:   BMI:     BP:(!) 146/92  HR:87bpm  TEMP: ( )  RESP:  Physical Exam  Ortho Exam Imaging: No results found.

## 2019-05-03 ENCOUNTER — Encounter: Payer: Self-pay | Admitting: Physical Medicine and Rehabilitation

## 2019-06-07 ENCOUNTER — Ambulatory Visit: Payer: BC Managed Care – PPO | Admitting: Medical

## 2019-06-07 ENCOUNTER — Encounter: Payer: Self-pay | Admitting: Medical

## 2019-06-07 ENCOUNTER — Other Ambulatory Visit: Payer: Self-pay

## 2019-06-07 VITALS — Ht 72.0 in | Wt 230.0 lb

## 2019-06-07 DIAGNOSIS — J0110 Acute frontal sinusitis, unspecified: Secondary | ICD-10-CM | POA: Insufficient documentation

## 2019-06-07 DIAGNOSIS — Z6832 Body mass index (BMI) 32.0-32.9, adult: Secondary | ICD-10-CM

## 2019-06-07 DIAGNOSIS — E669 Obesity, unspecified: Secondary | ICD-10-CM | POA: Diagnosis not present

## 2019-06-07 DIAGNOSIS — J3489 Other specified disorders of nose and nasal sinuses: Secondary | ICD-10-CM | POA: Diagnosis not present

## 2019-06-07 DIAGNOSIS — R030 Elevated blood-pressure reading, without diagnosis of hypertension: Secondary | ICD-10-CM | POA: Diagnosis not present

## 2019-06-07 DIAGNOSIS — Z8249 Family history of ischemic heart disease and other diseases of the circulatory system: Secondary | ICD-10-CM

## 2019-06-07 MED ORDER — AMOXICILLIN 875 MG PO TABS: 875.0000 mg | ORAL_TABLET | Freq: Two times a day (BID) | ORAL | 0 refills | Status: DC

## 2019-06-07 NOTE — Progress Notes (Signed)
Subjective:     Patient ID: Aaron Grim., male   DOB: 12/31/1965, 53 y.o.   MRN: [id number]  This visit type was conducted due to national recommendations for restrictions regarding the COVID-19 Pandemic (e.g. social distancing) in an effort to limit this patient's exposure and mitigate transmission in our community.  Due to their co-morbid illnesses, this patient is at least at moderate risk for complications without adequate follow up.  This format is felt to be most appropriate for this patient at this time.    Documentation for virtual audio and video telecommunications through Zoom encounter:  The patient was located at home. The provider was located in the office. The patient did consent to this visit and is aware of possible charges through their insurance for this visit.  The other persons participating in this telemedicine service were none. Time spent on call was 20 minutes and in review of previous records 20 minutes total.  This virtual service is not related to other E/M service within previous 7 days.   HPI Chief Complaint  Patient presents with  . Sinus Problem    headache,congrestion,facial pressure   Here for possible sinus infection/sinus pressure.  Worse as the day goes on.   Hurts in sinus over eyes.   Improved in the morning or after hot shower.  Using tylenol sinus with intermittent improvement.   Currently feels sinus pressure.  No sore throat, no NVD, no fever.  No cough.  No sore throat or irritate throat.  No sneezing, no water eyes, stuffy nose.   Mainly pain and pressure in sinuses.      Average 30 day 137/90.   Has been recording BPs periodically since last visit.  Most readings have been elevated.   No other aggravating or relieving factors. No other complaint.   Past Medical History:  Diagnosis Date  . Chest pain    cath 12/31/10: post AV CFX < 10% (no significant CAD), EF 60%   . Family history of ischemic heart disease   . GERD  (gastroesophageal reflux disease)    intermittent  . HLD (hyperlipidemia)   . Hx of cardiovascular stress test 11/2016   normal exercise stress test  . Hx of echocardiogram Aug 2012   Normal  . Lipoma    Dr. Greer Pickerel, consult 2013, pending surgery  . Obesity   . Peyronie disease    Urology consult prior   Current Outpatient Medications on File Prior to Visit  Medication Sig Dispense Refill  . lovastatin (MEVACOR) 40 MG tablet Take 1 tablet (40 mg total) by mouth at bedtime. 90 tablet 3  . methocarbamol (ROBAXIN) 500 MG tablet Take 1 tablet (500 mg total) by mouth every 8 (eight) hours as needed for muscle spasms. 12 tablet 0   No current facility-administered medications on file prior to visit.   Family History  Problem Relation Age of Onset  . Asthma Mother   . Mental illness Mother   . Heart disease Mother 74       died of CHF  . Arthritis Father   . Diabetes Father   . Heart disease Father 58       heart transplant, died of heart disease 5 years later  . Hypertension Brother   . Arthritis Paternal Aunt   . Diabetes Paternal Aunt   . Arthritis Paternal Uncle   . Diabetes Paternal Uncle   . Clotting disorder Maternal Aunt   . Colon cancer Maternal Aunt   .  Cancer Maternal Grandmother   . Stroke Neg Hx      Review of Systems As in subjective    Objective:   Physical Exam Due to coronavirus pandemic stay at home measures, patient visit was virtual and they were not examined in person.   Ht 6' (1.829 m)   Wt 230 lb (104.3 kg)   BMI 31.19 kg/m   Wt Readings from Last 3 Encounters:  06/07/19 230 lb (104.3 kg)  04/17/19 230 lb 9.6 oz (104.6 kg)  03/03/19 228 lb 9.6 oz (103.7 kg)       Assessment:     Encounter Diagnoses  Name Primary?  . Sinus pressure Yes  . Acute non-recurrent frontal sinusitis   . Elevated blood pressure reading   . Class 1 obesity with serious comorbidity and body mass index (BMI) of 32.0 to 32.9 in adult, unspecified obesity type    . Family history of heart disease in male family member before age 27        Plan:     Sinus pressure, likely sinusitis-begin amoxicillin, rest, hydrate well and if not much improved within a week let me know.   I do not think this is Covid as he was asymptomatic Covid positive few months ago.  We discussed his recent elevated blood pressure readings although most of his readings here have been normal or borderline.  He will continue working on lifestyle change and weight loss healthy diet limiting salt.  He decided against medication at this time and we discussed medication versus just lifestyle changes to address the blood pressure issue.  I advised he consider a cardiac CT coronary score.  He will think about this and let me know as he has strong risk factors for heart disease.  I reviewed his 2018 stress test in 2014 echocardiogram   Shabsi was seen today for sinus problem.  Diagnoses and all orders for this visit:  Sinus pressure  Acute non-recurrent frontal sinusitis  Elevated blood pressure reading  Class 1 obesity with serious comorbidity and body mass index (BMI) of 32.0 to 32.9 in adult, unspecified obesity type  Family history of heart disease in male family member before age 32  Other orders -     amoxicillin (AMOXIL) 875 MG tablet; Take 1 tablet (875 mg total) by mouth 2 (two) times daily.

## 2019-08-05 ENCOUNTER — Ambulatory Visit: Payer: BC Managed Care – PPO

## 2019-08-30 ENCOUNTER — Ambulatory Visit: Payer: BC Managed Care – PPO | Admitting: Medical

## 2019-08-30 ENCOUNTER — Other Ambulatory Visit: Payer: Self-pay

## 2019-08-30 ENCOUNTER — Encounter: Payer: Self-pay | Admitting: Medical

## 2019-08-30 VITALS — BP 120/84 | HR 75 | Temp 98.2°F | Ht 72.0 in | Wt 234.4 lb

## 2019-08-30 DIAGNOSIS — Z8249 Family history of ischemic heart disease and other diseases of the circulatory system: Secondary | ICD-10-CM

## 2019-08-30 DIAGNOSIS — E785 Hyperlipidemia, unspecified: Secondary | ICD-10-CM | POA: Diagnosis not present

## 2019-08-30 DIAGNOSIS — E669 Obesity, unspecified: Secondary | ICD-10-CM

## 2019-08-30 DIAGNOSIS — Z6832 Body mass index (BMI) 32.0-32.9, adult: Secondary | ICD-10-CM | POA: Diagnosis not present

## 2019-08-30 NOTE — Progress Notes (Signed)
Subjective: Chief Complaint  Patient presents with  . Follow-up    blood pressure check    Here for concerns about blood pressure.  He is using a home blood pressure monitor and his average readings are 140/90.  He denies chest pain, shortness of breath, edema.  But he wants to stay on top of his blood pressure issue since both parents had heart disease in her 18s.  He is a non-smoker.  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  He has come in multiple times in the past year or 2 regarding concerns about blood pressure.  He does not have his blood pressure cuff with him today.  Past Medical History:  Diagnosis Date  . Chest pain    cath 12/31/10: post AV CFX < 10% (no significant CAD), EF 60%   . Family history of ischemic heart disease   . GERD (gastroesophageal reflux disease)    intermittent  . HLD (hyperlipidemia)   . Hx of cardiovascular stress test 11/2016   normal exercise stress test  . Hx of echocardiogram Aug 2012   Normal  . Lipoma    Dr. Greer Pickerel, consult 2013, pending surgery  . Obesity   . Peyronie disease    Urology consult prior   Current Outpatient Medications on File Prior to Visit  Medication Sig Dispense Refill  . lovastatin (MEVACOR) 40 MG tablet Take 1 tablet (40 mg total) by mouth at bedtime. 90 tablet 3  . amoxicillin (AMOXIL) 875 MG tablet Take 1 tablet (875 mg total) by mouth 2 (two) times daily. (Patient not taking: Reported on 08/30/2019) 20 tablet 0   No current facility-administered medications on file prior to visit.    Family History  Problem Relation Age of Onset  . Asthma Mother   . Mental illness Mother   . Heart disease Mother 38       died of CHF  . Arthritis Father   . Diabetes Father   . Heart disease Father 39       heart transplant, died of heart disease 5 years later  . Hypertension Brother   . Arthritis Paternal Aunt   . Diabetes Paternal Aunt   . Arthritis  Paternal Uncle   . Diabetes Paternal Uncle   . Clotting disorder Maternal Aunt   . Colon cancer Maternal Aunt   . Cancer Maternal Grandmother   . Stroke Neg Hx       Objective: BP 120/84   Pulse 75   Temp 98.2 F (36.8 C)   Ht 6' (1.829 m)   Wt 234 lb 6.4 oz (106.3 kg)   SpO2 97%   BMI 31.79 kg/m    General appearence: alert, no distress, WD/WN,  Neck: supple, no lymphadenopathy, no thyromegaly, no masses, no bruits Heart: RRR, normal S1, S2, no murmurs Lungs: CTA bilaterally, no wheezes, rhonchi, or rales pulses: 2+ symmetric, upper and lower extremities, normal cap refill Ext: no edema      Assessment: Encounter Diagnoses  Name Primary?  . Hyperlipidemia, unspecified hyperlipidemia type Yes  . Family history of heart disease in male family member before age 59   . Class 1 obesity with serious comorbidity and body mass index (BMI) of 32.0 to 32.9 in adult, unspecified obesity type      Plan: We discussed his concerns and family history.  We reviewed blood pressure readings over the past 2 years.  My personal  blood pressure reading on him today was completely normal.  His home readings are elevated.  I advised it would be helpful for him to bring in his cuff to compare side-by-side.  More importantly given his family history, his current age, I think it would be reasonable to do a follow-up consult with cardiology to consider echocardiogram versus stress test versus cardiac CT calcium score or what ever they deem most helpful to give him further evaluation on his overall risk.  He will continue statin.  He will add aspirin daily at bedtime.  He will continue exercise but discussed more disciplined effort to lose weight through healthy diet.  I asked him to bring his blood pressure cuff to his cardiology appointment.  He is ASCVD score was 6.5%   Sourya was seen today for follow-up.  Diagnoses and all orders for this visit:  Hyperlipidemia, unspecified  hyperlipidemia type -     Ambulatory referral to Cardiology  Family history of heart disease in male family member before age 61 -     Ambulatory referral to Cardiology  Class 1 obesity with serious comorbidity and body mass index (BMI) of 32.0 to 32.9 in adult, unspecified obesity type -     Ambulatory referral to Cardiology    F/u pending cardiology consult

## 2019-08-30 NOTE — Progress Notes (Signed)
Referral has been corrected for patient got return to [facility] [address]

## 2019-09-13 ENCOUNTER — Other Ambulatory Visit: Payer: Self-pay

## 2019-09-13 ENCOUNTER — Ambulatory Visit: Payer: BC Managed Care – PPO | Admitting: Medical

## 2019-09-13 ENCOUNTER — Encounter: Payer: Self-pay | Admitting: Medical

## 2019-09-13 VITALS — BP 132/88 | HR 66 | Temp 98.2°F | Ht 72.0 in | Wt 231.8 lb

## 2019-09-13 DIAGNOSIS — M549 Dorsalgia, unspecified: Secondary | ICD-10-CM

## 2019-09-13 LAB — POCT URINALYSIS DIP (PROADVANTAGE DEVICE)
Bilirubin, UA: NEGATIVE
Blood, UA: NEGATIVE
Glucose, UA: NEGATIVE mg/dL
Ketones, POC UA: NEGATIVE mg/dL
Leukocytes, UA: NEGATIVE
Nitrite, UA: NEGATIVE
Protein Ur, POC: NEGATIVE mg/dL
Specific Gravity, Urine: 1.025
Urobilinogen, Ur: NEGATIVE
pH, UA: 6 (ref 5.0–8.0)

## 2019-09-13 MED ORDER — NAPROXEN 500 MG PO TABS: 500.0000 mg | ORAL_TABLET | Freq: Two times a day (BID) | ORAL | 0 refills | Status: DC

## 2019-09-13 MED ORDER — METHOCARBAMOL 500 MG PO TABS: 500.0000 mg | ORAL_TABLET | Freq: Two times a day (BID) | ORAL | 0 refills | Status: DC | PRN

## 2019-09-13 NOTE — Progress Notes (Signed)
Subjective: Chief Complaint  Patient presents with  . Back Pain    x2 weeks    Here for back pains on left lower back x 2 weeks.   No recent injury or strenuous activity.   Has been persistent.  No pain down legs, no numbness, tingling, weakness.  No saddle anesthesia.  No fall.  Pain is worsened sometimes on its own, sometimes with moving certain ways.   Wanted to make sure kidney were ok.     Has hx/o lumbar back issues, had EDSI in 03/2019 with Dr. Magnus Sinning.   No blood in urine, no urinary frequency, no urgency.   No dysuria.   Sometimes gets a garlic or onion smell in the urine.  No hx/o UTI.   No hx/o kidney stone.    Past Medical History:  Diagnosis Date  . Chest pain    cath 12/31/10: post AV CFX < 10% (no significant CAD), EF 60%   . Family history of ischemic heart disease   . GERD (gastroesophageal reflux disease)    intermittent  . HLD (hyperlipidemia)   . Hx of cardiovascular stress test 11/2016   normal exercise stress test  . Hx of echocardiogram Aug 2012   Normal  . Lipoma    Dr. Greer Pickerel, consult 2013, pending surgery  . Obesity   . Peyronie disease    Urology consult prior   Current Outpatient Medications on File Prior to Visit  Medication Sig Dispense Refill  . lovastatin (MEVACOR) 40 MG tablet Take 1 tablet (40 mg total) by mouth at bedtime. 90 tablet 3  . amoxicillin (AMOXIL) 875 MG tablet Take 1 tablet (875 mg total) by mouth 2 (two) times daily. (Patient not taking: Reported on 08/30/2019) 20 tablet 0   No current facility-administered medications on file prior to visit.   Past Surgical History:  Procedure Laterality Date  . CARDIAC CATHETERIZATION  12/2010   Dr. Marigene Ehlers   . COLONOSCOPY  04/2017   normal, Dr. Zenovia Jarred  . LIPOMA EXCISION N/A 01/03/2016   Procedure: EXCISION LOWER BACK LIPOMA;  Surgeon: Greer Pickerel, MD;  Location: [facility];  Service: General;  Laterality: N/A;  . stye surgery      ROS as in subjective    Objective: BP  132/88   Pulse 66   Temp 98.2 F (36.8 C)   Ht 6' (1.829 m)   Wt 231 lb 12.8 oz (105.1 kg)   SpO2 97%   BMI 31.44 kg/m   Gen: wd, wn ,nad Skin no rash.  There is a bit of asymmetry of back crease, left lower back crease rides a little lower than right Back nontender to palpation, ROM full, but mild pain with flexion, extension and lateral flexion in left lower back, lumbar surgical scar noted from prior lipoma excision Legs nontender, hips normal ROM without pain, possible slight left leg shorter than right asymmetry Legs neurovascularly intact Abdomen: +bs, soft, nontender, no mass, no organomegaly    Assessment: Encounter Diagnosis  Name Primary?  . Left-sided back pain, unspecified back location, unspecified chronicity Yes     Plan: Discussed symptoms, possible differential.  He has a hx/o chronic back pain, s/p EDSI 03/2019 with orthopedics in lumbar spine.  No recent instigating event.   Begin short term NSAID and muscle relaxer as below the next 4-5 days, rest, use stretching, and can use heat as discussed.  reviewed 03/2019 MRI lumbar spine as well as orthopedic office notes from 03/2019.  If not much improved within 4-5 days, call back.  [place] was seen today for back pain.  Diagnoses and all orders for this visit:  Left-sided back pain, unspecified back location, unspecified chronicity -     POCT Urinalysis DIP (Proadvantage Device) -     Basic metabolic panel  Other orders -     naproxen (NAPROSYN) 500 MG tablet; Take 1 tablet (500 mg total) by mouth 2 (two) times daily with a meal. -     methocarbamol (ROBAXIN) 500 MG tablet; Take 1 tablet (500 mg total) by mouth 2 (two) times daily as needed for muscle spasms.   F/u pending lab

## 2019-09-14 LAB — BASIC METABOLIC PANEL
BUN/Creatinine Ratio: 14 (ref 9–20)
BUN: 13 mg/dL (ref 6–24)
CO2: 23 mmol/L (ref 20–29)
Calcium: 9.1 mg/dL (ref 8.7–10.2)
Chloride: 102 mmol/L (ref 96–106)
Creatinine, Ser: 0.94 mg/dL (ref 0.76–1.27)
GFR calc Af Amer: 107 mL/min/{1.73_m2} (ref >59)
GFR calc non Af Amer: 93 mL/min/{1.73_m2} (ref >59)
Glucose: 91 mg/dL (ref 65–99)
Potassium: 4.2 mmol/L (ref 3.5–5.2)
Sodium: 139 mmol/L (ref 134–144)

## 2019-09-27 NOTE — Progress Notes (Signed)
CARDIOLOGY OFFICE NOTE  Date:  10/09/2019    Aaron Patel. Date of Birth: 06-May-1966 Medical Record [id number]  PCP:  Carlena Hurl, PA-C  Cardiologist:  Servando Snare   Chief Complaint  Patient presents with  . Follow-up    History of Present Illness: Aaron Patel. is a 54 y.o. male who presents today for a follow up visit. He has seen Dr. Aundra Dubin last in 2014.   He last saw me in June of 2018.   He has a history of remote cath due to mildly elevated troponin in 2012 - no significant CAD. EF 60%. He has had a history of palpitations and has had prior event monitor showing occasional PVCs and otherwise ok. Remote negative sleep study noted.   When seen in 2014 - had had some atypical chest pain - non exertional - PPI was added and GXT was recommended - this was normal and he had good exercise tolerance noted.   Last visit with me in 2018 - basically wanting to be proactive for his health. No worrisome issues but had had a spell of getting overheated/dehydrated and was short of breath - had not recurred. His father has had heart transplant and died at 36 - started having issues in his 59's. Both parents probably had CHF.   The patient does not have symptoms concerning for COVID-19 infection (fever, chills, cough, or new shortness of breath).   Comes in today. Here alone.  Wanting to get "check up". He is doing ok. Had COVID back in the fall - did recover without issue. No chest pain. Breathing is good. Remains on statin. Admits he has gotten a little lax with exercise. Asking about possible calcium scoring testing and what that entails. Wants to be proactive for his health.   Past Medical History:  Diagnosis Date  . Chest pain    cath 12/31/10: post AV CFX < 10% (no significant CAD), EF 60%   . Family history of ischemic heart disease   . GERD (gastroesophageal reflux disease)    intermittent  . HLD (hyperlipidemia)   . Hx of cardiovascular stress test 11/2016     normal exercise stress test  . Hx of echocardiogram Aug 2012   Normal  . Lipoma    Dr. Greer Pickerel, consult 2013, pending surgery  . Obesity   . Peyronie disease    Urology consult prior    Past Surgical History:  Procedure Laterality Date  . CARDIAC CATHETERIZATION  12/2010   Dr. Marigene Ehlers   . COLONOSCOPY  04/2017   normal, Dr. Zenovia Jarred  . LIPOMA EXCISION N/A 01/03/2016   Procedure: EXCISION LOWER BACK LIPOMA;  Surgeon: Greer Pickerel, MD;  Location: [facility];  Service: General;  Laterality: N/A;  . stye surgery       Medications: Current Meds  Medication Sig  . lovastatin (MEVACOR) 40 MG tablet Take 1 tablet (40 mg total) by mouth at bedtime.  . methocarbamol (ROBAXIN) 500 MG tablet Take 1 tablet (500 mg total) by mouth 2 (two) times daily as needed for muscle spasms.  . naproxen (NAPROSYN) 500 MG tablet Take 1 tablet (500 mg total) by mouth 2 (two) times daily with a meal.     Allergies: Allergies  Allergen Reactions  . Iodinated Diagnostic Agents Hives and Rash    Social History: The patient  reports that he quit smoking about 13 years ago. He smoked 0.00 packs per day for 0.50 years. He has  never used smokeless tobacco. He reports current alcohol use of about 1.0 standard drinks of alcohol per week. He reports that he does not use drugs.   Family History: The patient's family history includes Arthritis in his father, paternal aunt, and paternal uncle; Asthma in his mother; Cancer in his maternal grandmother; Clotting disorder in his maternal aunt; Colon cancer in his maternal aunt; Diabetes in his father, paternal aunt, and paternal uncle; Heart disease (age of onset: 33) in his father and mother; Hypertension in his brother; Mental illness in his mother.   Review of Systems: Please see the history of present illness.   All other systems are reviewed and negative.   Physical Exam: VS:  BP 122/80   Pulse 69   Ht 6' (1.829 m)   Wt 232 lb (105.2 kg)   SpO2 95%   BMI  31.46 kg/m  .  BMI Body mass index is 31.46 kg/m.  Wt Readings from Last 3 Encounters:  10/09/19 232 lb (105.2 kg)  09/13/19 231 lb 12.8 oz (105.1 kg)  08/30/19 234 lb 6.4 oz (106.3 kg)    General: Pleasant. Alert and in no acute distress.  Weight is unchanged since last visit with me.  Cardiac: Regular rate and rhythm. No murmurs, rubs, or gallops. No edema.  Respiratory:  Lungs are clear to auscultation bilaterally with normal work of breathing.  GI: Soft and nontender.  MS: No deformity or atrophy. Gait and ROM intact.  Skin: Warm and dry. Color is normal.  Neuro:  Strength and sensation are intact and no gross focal deficits noted.  Psych: Alert, appropriate and with normal affect.   LABORATORY DATA:  EKG:  EKG is ordered today.  Personally reviewed by me. This demonstrates NSR.  Lab Results  Component Value Date   WBC 5.2 03/03/2019   HGB 15.5 03/03/2019   HCT 46.0 03/03/2019   PLT 314 03/03/2019   GLUCOSE 91 09/13/2019   CHOL 168 03/03/2019   TRIG 94 03/03/2019   HDL 49 03/03/2019   LDLCALC 102 (H) 03/03/2019   ALT 20 03/03/2019   AST 16 03/03/2019   NA 139 09/13/2019   K 4.2 09/13/2019   CL 102 09/13/2019   CREATININE 0.94 09/13/2019   BUN 13 09/13/2019   CO2 23 09/13/2019   TSH 0.99 12/03/2011   PSA 1.2 01/07/2017   INR 0.99 11/09/2014   HGBA1C 5.6 01/14/2018     BNP (last 3 results) No results for input(s): BNP in the last 8760 hours.  ProBNP (last 3 results) No results for input(s): PROBNP in the last 8760 hours.   Other Studies Reviewed Today:  Echo Study Conclusions 2012  - Left ventricle: The cavity size was normal. Wall thickness was normal. Systolic function was normal. The estimated ejection fraction was in the range of 60% to 65%. Wall motion was normal; there were no regional wall motion abnormalities. Left ventricular diastolic function parameters were normal. - Aortic valve: There was no stenosis. - Mitral valve: Trivial  regurgitation. - Right ventricle: The cavity size was normal. Systolic function was normal. - Pulmonary arteries: No complete TR doppler jet so unable to estimate PA systolic pressure. - Inferior vena cava: The vessel was normal in size; the respirophasic diameter changes were in the normal range (= 50%); findings are consistent with normal central venous pressure. Impressions:  - Normal study   Coronary angiography 2012: 1. Left main coronary artery: The vessel is normal in size, but short. It is free of  any significant disease. 2. Left circumflex: The vessel is large in size and codominant. It has minor irregularities without any obstructive disease. OM-1 is small in size and free of significant disease. OM-2 is medium size and free of significant disease. OM-3 is normal in size. The posterior AV groove is normal in size with mild proximal disease of less than 10%. It gives two posterolateral branches. 3. Left anterior descending artery: The vessel is normal in size with minor irregularities, but no evidence of obstructive disease. The LAD itself does not reach the apex which is supplied by a large third diagonal branch which actually wraps around the apex. First and second diagonals are normal in size and free of significant disease. 4. Right coronary artery: The vessel is normal in size and codominant. It is free of any significant disease. It gives a medium-sized PDA distally.  STUDY CONCLUSIONS: 1. No significant coronary artery disease. 2. Normal LV systolic function. 3. Chest pain is likely noncardiac.   Assessment/Plan:  1. CV risk factors with +FH for CAD and HLD - he is interested in calcium scoring - we will arrange.   2. Palpitations - has not recurred.   3. HLD - on statin - lab with PCP.   4. Remote cardiac cath with no significant CAD noted - continue with CV risk factor  modification.   5. COVID-19 Education: The signs and symptoms of COVID-19 were discussed with the patient and how to seek care for testing (follow up with PCP or arrange E-visit).  The importance of social distancing, staying at home, hand hygiene and wearing a mask when out in public were discussed today.  Current medicines are reviewed with the patient today.  The patient does not have concerns regarding medicines other than what has been noted above.  The following changes have been made:  See above.  Labs/ tests ordered today include:    Orders Placed This Encounter  Procedures  . CT CARDIAC SCORING  . EKG 12-Lead     Disposition:   FU with [place] prn.     Patient is agreeable to this plan and will call if any problems develop in the interim.   SignedTruitt Merle, NP  10/09/2019 [facility] [address] [place], [place]  [address] Phone: [phone] Fax: [phone]

## 2019-10-09 ENCOUNTER — Ambulatory Visit: Payer: BC Managed Care – PPO | Admitting: Nurse Practitioner

## 2019-10-09 ENCOUNTER — Other Ambulatory Visit: Payer: Self-pay

## 2019-10-09 ENCOUNTER — Encounter: Payer: Self-pay | Admitting: Nurse Practitioner

## 2019-10-09 VITALS — BP 122/80 | HR 69 | Ht 72.0 in | Wt 232.0 lb

## 2019-10-09 DIAGNOSIS — R002 Palpitations: Secondary | ICD-10-CM | POA: Diagnosis not present

## 2019-10-09 DIAGNOSIS — Z9189 Other specified personal risk factors, not elsewhere classified: Secondary | ICD-10-CM | POA: Diagnosis not present

## 2019-10-09 DIAGNOSIS — Z7189 Other specified counseling: Secondary | ICD-10-CM | POA: Diagnosis not present

## 2019-10-09 NOTE — Patient Instructions (Addendum)
  After Visit Summary:  We will be checking the following labs today - NONE   Medication Instructions:    Continue with your current medicines.    If you need a refill on your cardiac medications before your next appointment, please call your pharmacy.     Testing/Procedures To Be Arranged:  Calcium score  Follow-Up:   See [place] back as needed.     At [facility], you and your health needs are our priority.  As part of our continuing mission to provide you with exceptional heart care, we have created designated Provider Care Teams.  These Care Teams include your primary Cardiologist (physician) and Advanced Practice Providers (APPs -  Physician Assistants and Nurse Practitioners) who all work together to provide you with the care you need, when you need it.  Special Instructions:  . Stay safe, stay home, wash your hands for at least 20 seconds and wear a mask when out in public.  . It was good to talk with you today.    Call the [place] office at [phone] if you have any questions, problems or concerns.

## 2019-10-12 ENCOUNTER — Other Ambulatory Visit: Payer: Self-pay

## 2019-10-12 ENCOUNTER — Ambulatory Visit (INDEPENDENT_AMBULATORY_CARE_PROVIDER_SITE_OTHER)
Admission: RE | Admit: 2019-10-12 | Discharge: 2019-10-12 | Disposition: A | Discharge status: Home / Self Care | Payer: Self-pay | Source: Ambulatory Visit | Attending: Nurse Practitioner | Admitting: Nurse Practitioner

## 2019-10-12 DIAGNOSIS — Z9189 Other specified personal risk factors, not elsewhere classified: Secondary | ICD-10-CM

## 2019-11-09 ENCOUNTER — Ambulatory Visit (INDEPENDENT_AMBULATORY_CARE_PROVIDER_SITE_OTHER): Payer: BC Managed Care – PPO

## 2019-11-09 ENCOUNTER — Ambulatory Visit (INDEPENDENT_AMBULATORY_CARE_PROVIDER_SITE_OTHER): Payer: BC Managed Care – PPO | Admitting: Podiatry

## 2019-11-09 ENCOUNTER — Other Ambulatory Visit: Payer: Self-pay | Admitting: Podiatry

## 2019-11-09 ENCOUNTER — Other Ambulatory Visit: Payer: Self-pay

## 2019-11-09 ENCOUNTER — Encounter: Payer: Self-pay | Admitting: Podiatry

## 2019-11-09 VITALS — Temp 97.5°F

## 2019-11-09 DIAGNOSIS — M7661 Achilles tendinitis, right leg: Secondary | ICD-10-CM

## 2019-11-09 DIAGNOSIS — M79672 Pain in left foot: Secondary | ICD-10-CM | POA: Diagnosis not present

## 2019-11-09 DIAGNOSIS — M7662 Achilles tendinitis, left leg: Secondary | ICD-10-CM

## 2019-11-09 DIAGNOSIS — M79671 Pain in right foot: Secondary | ICD-10-CM | POA: Diagnosis not present

## 2019-11-09 MED ORDER — DICLOFENAC SODIUM 75 MG PO TBEC
75.0000 mg | DELAYED_RELEASE_TABLET | Freq: Two times a day (BID) | ORAL | 2 refills | Status: DC
Start: 2019-11-09 — End: 2020-05-08

## 2019-11-09 MED ORDER — NITROGLYCERIN 0.1 MG/HR TD PT24: 0.2000 mg | MEDICATED_PATCH | Freq: Every day | TRANSDERMAL | Status: DC

## 2019-11-09 NOTE — Patient Instructions (Signed)

## 2019-11-13 NOTE — Progress Notes (Signed)
Subjective:   Patient ID: Aaron Grim., male   DOB: 54 y.o.   MRN: [id number]   HPI Patient presents stating that he has had trouble with the back of both of his heels that both of been very sore and its been going on for a long time.  States that this has been ongoing and increasingly difficult for the patient to walk with and does not have specific injury associated with it.  Patient does not smoke likes to be active   Review of Systems  All other systems reviewed and are negative.       Objective:  Physical Exam Vitals and nursing note reviewed.  Constitutional:      Appearance: He is well-developed.  Pulmonary:     Effort: Pulmonary effort is normal.  Musculoskeletal:        General: Normal range of motion.  Skin:    General: Skin is warm.  Neurological:     Mental Status: He is alert.     Neurovascular status found to be intact muscle strength was found to be adequate.  Range of motion within normal limits with patient noted to have discomfort in the posterior heel region bilateral with inflammation fluid of the insertion into the posterior calcaneus.  Patient is noted to have good digital perfusion well oriented x3     Assessment:  Chronic Achilles tendinitis bilateral that is worse after periods of sitting     Plan:  H&P reviewed conditions.  I recommended night splints to try to stretch out along with aggressive heat ice therapy we will get a send to physical therapy and will start nitro patches for this.  It is more the muscle tendon junction so I do not recommend any kind of injections and this does not appear to be any kind of surgical issue  X-rays indicate that there is no signs of spurring or stress fracture bilateral

## 2019-11-16 ENCOUNTER — Telehealth: Payer: Self-pay | Admitting: Podiatry

## 2019-11-16 MED ORDER — NITROGLYCERIN 0.4 MG/HR TD PT24
0.4000 mg | MEDICATED_PATCH | Freq: Every day | TRANSDERMAL | 12 refills | Status: DC
Start: 2019-11-16 — End: 2020-05-08

## 2019-11-16 NOTE — Addendum Note (Signed)
Addended by: Harriett Sine D on: 11/16/2019 04:51 PM   Modules accepted: Orders

## 2019-11-16 NOTE — Telephone Encounter (Signed)
Pt calling to check on his prescription for Nitroglycerin patches. Pts pharmacy never received the prescription.  Please resend to [facility] on [address], no pharmacy was selected in the original e-scribe

## 2019-11-20 ENCOUNTER — Other Ambulatory Visit: Payer: Self-pay

## 2019-11-20 ENCOUNTER — Encounter: Payer: Self-pay | Admitting: Medical

## 2019-11-20 ENCOUNTER — Ambulatory Visit (INDEPENDENT_AMBULATORY_CARE_PROVIDER_SITE_OTHER): Payer: BC Managed Care – PPO | Admitting: Medical

## 2019-11-20 VITALS — BP 130/80 | HR 77 | Ht 72.0 in | Wt 230.4 lb

## 2019-11-20 DIAGNOSIS — R229 Localized swelling, mass and lump, unspecified: Secondary | ICD-10-CM

## 2019-11-20 DIAGNOSIS — K036 Deposits [accretions] on teeth: Secondary | ICD-10-CM | POA: Diagnosis not present

## 2019-11-20 DIAGNOSIS — L989 Disorder of the skin and subcutaneous tissue, unspecified: Secondary | ICD-10-CM

## 2019-11-20 DIAGNOSIS — L237 Allergic contact dermatitis due to plants, except food: Secondary | ICD-10-CM | POA: Diagnosis not present

## 2019-11-20 MED ORDER — AMOXICILLIN-POT CLAVULANATE 875-125 MG PO TABS
1.0000 | ORAL_TABLET | Freq: Two times a day (BID) | ORAL | 0 refills | Status: DC
Start: 2019-11-20 — End: 2020-05-08

## 2019-11-20 NOTE — Progress Notes (Signed)
Subjective:  Aaron Fells. is a 54 y.o. male who presents for Chief Complaint  Patient presents with  . Cyst    painful knot under chin      Here for painful lump under chin.  Started 5 days ago where he noticed a lump under his chin.  Has not really gotten worse.  Is tender to touch at times but not very painful.  No recent fever, no nausea or vomiting, no known tooth pain or tooth issue.  Last dental visit over 2 years ago due to lack of dental insurance coverage.  No other not felt.  No drainage no redness.  No other aggravating or relieving factors.    No other c/o.  The following portions of the patient's history were reviewed and updated as appropriate: allergies, current medications, past family history, past medical history, past social history, past surgical history and problem list.  ROS Otherwise as in subjective above  Objective: BP 130/80   Pulse 77   Ht 6' (1.829 m)   Wt 230 lb 6.4 oz (104.5 kg)   SpO2 98%   BMI 31.25 kg/m   General appearance: alert, no distress, well developed, well nourished HEENT: normocephalic, there is a subcutaneous 3 to 4 mm diameter nodule that is tender to palpation mildly under the mandible inferior to the chin, seems to be more subcutaneous and not obvious lymph node or cyst, no redness, no drainage, no fluctuance or induration Oral cavity: MMM, no lesions, there is moderate dental plaque buildup but no obvious decay Neck: supple, no lymphadenopathy, no thyromegaly, no masses Healing patch of poison ivy dermatitis on the right forearm medially midshaft   Assessment: Encounter Diagnoses  Name Primary?  . Skin lesion Yes  . Lump of skin   . Dental plaque   . Poison ivy      Plan: Skin lesion likely inflamed hair follicle underneath the chin and in the beard area.  No obvious cyst, no obvious lymph node, no obvious boil.  Advise warm compresses, watch and wait approach.  If worsening signs of redness, pain, swelling, then begin  Augmentin below for possible boil  Dental plaque-advise he see dentist soon for routine dental care  Poison ivy dermatitis-resolving with the home remedy he has been using  Aaron Patel was seen today for cyst.  Diagnoses and all orders for this visit:  Skin lesion  Lump of skin  Dental plaque  Poison ivy  Other orders -     amoxicillin-clavulanate (AUGMENTIN) 875-125 MG tablet; Take 1 tablet by mouth 2 (two) times daily.    Follow up: prn

## 2019-12-07 ENCOUNTER — Encounter: Payer: Self-pay | Admitting: Podiatry

## 2019-12-07 ENCOUNTER — Other Ambulatory Visit: Payer: Self-pay

## 2019-12-07 ENCOUNTER — Ambulatory Visit (INDEPENDENT_AMBULATORY_CARE_PROVIDER_SITE_OTHER): Payer: BC Managed Care – PPO | Admitting: Podiatry

## 2019-12-07 DIAGNOSIS — M7661 Achilles tendinitis, right leg: Secondary | ICD-10-CM | POA: Diagnosis not present

## 2019-12-07 DIAGNOSIS — M7662 Achilles tendinitis, left leg: Secondary | ICD-10-CM | POA: Diagnosis not present

## 2019-12-08 NOTE — Progress Notes (Signed)
Subjective:   Patient ID: Aaron Grim., male   DOB: 54 y.o.   MRN: [id number]   HPI Patient presents stating he still having pain but he feels like the physical therapy is helping and he was not able to tolerate the nitro patches.  States he is doing stuff at home and is using the night splints which are helpful   ROS      Objective:  Physical Exam  Neurovascular status intact with discomfort still noted at the muscle tendon junction posterior Achilles bilateral that is moderate and still present with no nodular formation within the tendon itself     Assessment:  Achilles tendinitis left muscle tendon junction present     Plan:  Reviewed condition we will get a stop the nitro patches start oral anti-inflammatory begin diclofenac gel continue physical therapy and utilize aggressive heel lifts.  Today I did cast him for orthotics to lift his heel up in a more consistent pattern to try to take pain out of the posterior tendon and improve function and did discuss ultimate MRIs if symptoms were to persist

## 2020-01-08 ENCOUNTER — Ambulatory Visit: Payer: BC Managed Care – PPO | Admitting: Orthotics

## 2020-01-08 ENCOUNTER — Other Ambulatory Visit: Payer: Self-pay

## 2020-01-08 DIAGNOSIS — M79671 Pain in right foot: Secondary | ICD-10-CM

## 2020-01-08 DIAGNOSIS — M79672 Pain in left foot: Secondary | ICD-10-CM

## 2020-01-08 DIAGNOSIS — M7662 Achilles tendinitis, left leg: Secondary | ICD-10-CM

## 2020-01-08 DIAGNOSIS — Z2821 Immunization not carried out because of patient refusal: Secondary | ICD-10-CM

## 2020-01-08 DIAGNOSIS — M7661 Achilles tendinitis, right leg: Secondary | ICD-10-CM

## 2020-01-08 NOTE — Progress Notes (Signed)
Patient came in today to pick up custom made foot orthotics.  The goals were accomplished and the patient reported no dissatisfaction with said orthotics.  Patient was advised of breakin period and how to report any issues. 

## 2020-01-09 ENCOUNTER — Ambulatory Visit (HOSPITAL_COMMUNITY)
Admission: EM | Admit: 2020-01-09 | Discharge: 2020-01-09 | Disposition: A | Discharge status: Home / Self Care | Payer: BC Managed Care – PPO | Attending: Family Medicine | Admitting: Family Medicine

## 2020-01-09 ENCOUNTER — Encounter (HOSPITAL_COMMUNITY): Payer: Self-pay

## 2020-01-09 ENCOUNTER — Other Ambulatory Visit: Payer: Self-pay

## 2020-01-09 DIAGNOSIS — M19049 Primary osteoarthritis, unspecified hand: Secondary | ICD-10-CM

## 2020-01-09 MED ORDER — PREDNISONE 10 MG PO TABS
20.0000 mg | ORAL_TABLET | Freq: Every day | ORAL | 0 refills | Status: DC
Start: 2020-01-09 — End: 2020-05-08

## 2020-01-09 MED ORDER — DOXYCYCLINE HYCLATE 100 MG PO CAPS
100.0000 mg | ORAL_CAPSULE | Freq: Two times a day (BID) | ORAL | 0 refills | Status: DC
Start: 2020-01-09 — End: 2020-05-08

## 2020-01-09 NOTE — ED Triage Notes (Signed)
Pt presents with infection to right middle finger around his cuticles since yesterday.

## 2020-01-09 NOTE — Discharge Instructions (Addendum)
Take medicines as prescribed Call Dr. Amedeo Plenty if symptoms worsen

## 2020-01-09 NOTE — ED Provider Notes (Signed)
[facility]    CSN: [id number] Arrival date & time: 01/09/20  3212      History   Chief Complaint Chief Complaint  Patient presents with  . Finger Infection    HPI Aaron Patel. is a 54 y.o. male.   HPI  Past Medical History:  Diagnosis Date  . Chest pain    cath 12/31/10: post AV CFX < 10% (no significant CAD), EF 60%   . Family history of ischemic heart disease   . GERD (gastroesophageal reflux disease)    intermittent  . HLD (hyperlipidemia)   . Hx of cardiovascular stress test 11/2016   normal exercise stress test  . Hx of echocardiogram Aug 2012   Normal  . Lipoma    Dr. Greer Pickerel, consult 2013, pending surgery  . Obesity   . Peyronie disease    Urology consult prior    Patient Active Problem List   Diagnosis Date Noted  . Elevated blood pressure reading 06/07/2019  . Acute non-recurrent frontal sinusitis 06/07/2019  . Sinus pressure 06/07/2019  . Radiculopathy due to lumbar intervertebral disc disorder 03/28/2019  . Encounter for health maintenance examination in adult 03/03/2019  . Polyarthralgia 03/03/2019  . Strain of back 02/20/2019  . Influenza vaccination declined 02/20/2019  . Skin lesion 02/20/2019  . Nail deformity 02/20/2019  . Skin lesion of scrotum 08/24/2018  . Scrotal lesion 08/24/2018  . Testicular pain, right 08/24/2018  . Chronic bilateral low back pain 08/24/2018  . Exposure to herpes 01/14/2018  . Screen for colon cancer 01/07/2017  . Screening for prostate cancer 01/07/2017  . Family history of heart disease in male family member before age 29 12/24/2015  . Gastroesophageal reflux disease without esophagitis 12/24/2015  . Obesity 11/09/2014  . Lipoma of back 11/25/2011  . Peyronie's disease 05/04/2011  . HLD (hyperlipidemia)   . Chest pain     Past Surgical History:  Procedure Laterality Date  . CARDIAC CATHETERIZATION  12/2010   Dr. Marigene Ehlers   . COLONOSCOPY  04/2017   normal, Dr. Zenovia Jarred  . LIPOMA  EXCISION N/A 01/03/2016   Procedure: EXCISION LOWER BACK LIPOMA;  Surgeon: Greer Pickerel, MD;  Location: [facility];  Service: General;  Laterality: N/A;  . stye surgery         Home Medications    Prior to Admission medications   Medication Sig Start Date End Date Taking? Authorizing Provider  amoxicillin-clavulanate (AUGMENTIN) 875-125 MG tablet Take 1 tablet by mouth 2 (two) times daily. 11/20/19   Tysinger, Camelia Eng, PA-C  diclofenac (VOLTAREN) 75 MG EC tablet Take 1 tablet (75 mg total) by mouth 2 (two) times daily. 11/09/19   Wallene Huh, DPM  lovastatin (MEVACOR) 40 MG tablet Take 1 tablet (40 mg total) by mouth at bedtime. 03/06/19   Tysinger, Camelia Eng, PA-C  methocarbamol (ROBAXIN) 500 MG tablet Take 1 tablet (500 mg total) by mouth 2 (two) times daily as needed for muscle spasms. 09/13/19   Tysinger, Camelia Eng, PA-C  naproxen (NAPROSYN) 500 MG tablet Take 1 tablet (500 mg total) by mouth 2 (two) times daily with a meal. 09/13/19   Tysinger, Camelia Eng, PA-C  nitroGLYCERIN (NITRO-DUR) 0.4 mg/hr patch Place 1 patch (0.4 mg total) onto the skin daily. 11/16/19   Wallene Huh, DPM    Family History Family History  Problem Relation Age of Onset  . Asthma Mother   . Mental illness Mother   . Heart disease Mother 59  died of CHF  . Arthritis Father   . Diabetes Father   . Heart disease Father 74       heart transplant, died of heart disease 5 years later  . Hypertension Brother   . Arthritis Paternal Aunt   . Diabetes Paternal Aunt   . Arthritis Paternal Uncle   . Diabetes Paternal Uncle   . Clotting disorder Maternal Aunt   . Colon cancer Maternal Aunt   . Cancer Maternal Grandmother   . Stroke Neg Hx     Social History Social History   Tobacco Use  . Smoking status: Former Smoker    Packs/day: 0.00    Years: 0.50    Pack years: 0.00    Quit date: 06/08/2006    Years since quitting: 13.5  . Smokeless tobacco: Never Used  . Tobacco comment: recreational smoking, not every  day, quit in 2008  Vaping Use  . Vaping Use: Never used  Substance Use Topics  . Alcohol use: Yes    Alcohol/week: 1.0 standard drink    Types: 1 Shots of liquor per week    Comment: occasionally  . Drug use: No     Allergies   Iodinated diagnostic agents   Review of Systems Review of Systems  Musculoskeletal: Positive for arthralgias.       Pain in right middle finger     Physical Exam Triage Vital Signs ED Triage Vitals  Enc Vitals Group     BP 01/09/20 0826 (!) 141/96     Pulse Rate 01/09/20 0826 71     Resp 01/09/20 0826 18     Temp 01/09/20 0826 98.7 F (37.1 C)     Temp Source 01/09/20 0826 Oral     SpO2 01/09/20 0826 97 %     Weight --      Height --      Head Circumference --      Peak Flow --      Pain Score 01/09/20 0827 6     Pain Loc --      Pain Edu? --      Excl. in [place]? --    No data found.  Updated Vital Signs BP (!) 141/96 (BP Location: Right Arm)   Pulse 71   Temp 98.7 F (37.1 C) (Oral)   Resp 18   SpO2 97%   Visual Acuity Right Eye Distance:   Left Eye Distance:   Bilateral Distance:    Right Eye Near:   Left Eye Near:    Bilateral Near:     Physical Exam Vitals and nursing note reviewed.  Constitutional:      Appearance: Normal appearance.  Musculoskeletal:     Comments: Right middle finger is discolored and swollen. He is unable to flex it or make a fist and there is tenderness along the flexor surface of the finger. There are no open areas on the finger  Neurological:     Mental Status: He is alert.      UC Treatments / Results  Labs (all labs ordered are listed, but only abnormal results are displayed) Labs Reviewed - No data to display  EKG   Radiology No results found.  Procedures Procedures (including critical care time)  Medications Ordered in UC Medications - No data to display  Initial Impression / Assessment and Plan / UC Course  I have reviewed the triage vital signs and the nursing  notes.  Pertinent labs & imaging results that were available during my  care of the patient were reviewed by me and considered in my medical decision making (see chart for details).     Infection right middle finger discussed with hand surgeon, Dr. Amedeo Plenty.  He feels like an absence of a inoculation or open area of the skin this is more likely arthritic than infected.  Will treat with doxycycline and prednisone with instructions to follow-up with Dr. Amedeo Plenty if symptoms worsen Final Clinical Impressions(s) / UC Diagnoses   Final diagnoses:  None   Discharge Instructions   None    ED Prescriptions    None     PDMP not reviewed this encounter.   Wardell Honour, MD 01/09/20 [id number]

## 2020-03-04 ENCOUNTER — Encounter: Payer: BC Managed Care – PPO | Admitting: Medical

## 2020-03-05 ENCOUNTER — Telehealth: Payer: Self-pay | Admitting: Family Medicine

## 2020-03-05 NOTE — Telephone Encounter (Signed)
Pt emailed me a form that he needed completed for work. Form placed in Dr.Lalonde's folder

## 2020-03-29 ENCOUNTER — Encounter: Payer: BC Managed Care – PPO | Admitting: Medical

## 2020-04-18 ENCOUNTER — Encounter: Payer: Self-pay | Admitting: Family Medicine

## 2020-04-18 ENCOUNTER — Telehealth: Payer: Self-pay

## 2020-04-18 NOTE — Telephone Encounter (Signed)
Error

## 2020-04-29 ENCOUNTER — Other Ambulatory Visit: Payer: Self-pay

## 2020-04-29 ENCOUNTER — Encounter: Payer: Self-pay | Admitting: Family Medicine

## 2020-04-29 ENCOUNTER — Ambulatory Visit (INDEPENDENT_AMBULATORY_CARE_PROVIDER_SITE_OTHER): Payer: BC Managed Care – PPO | Admitting: Family Medicine

## 2020-04-29 VITALS — BP 124/86 | HR 73 | Temp 97.2°F | Wt 233.2 lb

## 2020-04-29 DIAGNOSIS — Z23 Encounter for immunization: Secondary | ICD-10-CM | POA: Diagnosis not present

## 2020-04-29 DIAGNOSIS — M549 Dorsalgia, unspecified: Secondary | ICD-10-CM | POA: Diagnosis not present

## 2020-04-29 NOTE — Progress Notes (Signed)
   Subjective:    Patient ID: [name]., male    DOB: 1966/03/22, 54 y.o.   MRN: [id number]  HPI He complains of a several month history of left-sided mid back pain that is worse with running.  He does referee basketball games and he does note the pain but notes that it does not interfere with his ability to function.  Takes a couple of days to go away.  There is no numbness, tingling, weakness, history of injury. Review of his record indicates need for immunization follow-up. Review of Systems     Objective:   Physical Exam Alert and in no distress.  Full motion of the back.  No tenderness palpation over the spine or paravertebral muscles.  Negative straight leg raising.  Normal DTRs.  Normal hip motion.       Assessment & Plan:  Left-sided back pain, unspecified back location, unspecified chronicity - Plan: DG Thoracic Spine 4V, DG Lumbar Spine Complete  Immunization, viral disease - Plan: Pfizer SARS-COV-2 Vaccine  Need for influenza vaccination - Plan: Flu Vaccine QUAD 6+ mos PF IM (Fluarix Quad PF) Discussed treatment of his back pain and if the x-rays are negative, I will send him for physical therapy.  Also discussed the possibility of chiropractic manipulation as well as acupuncture.

## 2020-05-05 ENCOUNTER — Other Ambulatory Visit: Payer: Self-pay | Admitting: Medical

## 2020-05-05 DIAGNOSIS — E785 Hyperlipidemia, unspecified: Secondary | ICD-10-CM

## 2020-05-05 DIAGNOSIS — Z8249 Family history of ischemic heart disease and other diseases of the circulatory system: Secondary | ICD-10-CM

## 2020-05-06 ENCOUNTER — Other Ambulatory Visit: Payer: Self-pay | Admitting: Family Medicine

## 2020-05-06 ENCOUNTER — Ambulatory Visit
Admission: RE | Admit: 2020-05-06 | Discharge: 2020-05-06 | Disposition: A | Discharge status: Home / Self Care | Payer: BC Managed Care – PPO | Source: Ambulatory Visit | Attending: Family Medicine | Admitting: Family Medicine

## 2020-05-06 DIAGNOSIS — M549 Dorsalgia, unspecified: Secondary | ICD-10-CM

## 2020-05-07 ENCOUNTER — Other Ambulatory Visit: Payer: Self-pay

## 2020-05-07 DIAGNOSIS — M549 Dorsalgia, unspecified: Secondary | ICD-10-CM

## 2020-05-08 ENCOUNTER — Ambulatory Visit (INDEPENDENT_AMBULATORY_CARE_PROVIDER_SITE_OTHER): Payer: BC Managed Care – PPO | Admitting: Medical

## 2020-05-08 ENCOUNTER — Other Ambulatory Visit: Payer: Self-pay

## 2020-05-08 ENCOUNTER — Encounter: Payer: Self-pay | Admitting: Medical

## 2020-05-08 VITALS — BP 110/80 | HR 72 | Ht 72.0 in | Wt 230.8 lb

## 2020-05-08 DIAGNOSIS — K219 Gastro-esophageal reflux disease without esophagitis: Secondary | ICD-10-CM | POA: Diagnosis not present

## 2020-05-08 DIAGNOSIS — Z8249 Family history of ischemic heart disease and other diseases of the circulatory system: Secondary | ICD-10-CM

## 2020-05-08 DIAGNOSIS — Z113 Encounter for screening for infections with a predominantly sexual mode of transmission: Secondary | ICD-10-CM

## 2020-05-08 DIAGNOSIS — E669 Obesity, unspecified: Secondary | ICD-10-CM

## 2020-05-08 DIAGNOSIS — N486 Induration penis plastica: Secondary | ICD-10-CM

## 2020-05-08 DIAGNOSIS — Z0000 Encounter for general adult medical examination without abnormal findings: Secondary | ICD-10-CM | POA: Diagnosis not present

## 2020-05-08 DIAGNOSIS — Z125 Encounter for screening for malignant neoplasm of prostate: Secondary | ICD-10-CM

## 2020-05-08 DIAGNOSIS — G8929 Other chronic pain: Secondary | ICD-10-CM

## 2020-05-08 DIAGNOSIS — E785 Hyperlipidemia, unspecified: Secondary | ICD-10-CM

## 2020-05-08 DIAGNOSIS — D171 Benign lipomatous neoplasm of skin and subcutaneous tissue of trunk: Secondary | ICD-10-CM

## 2020-05-08 DIAGNOSIS — Z6832 Body mass index (BMI) 32.0-32.9, adult: Secondary | ICD-10-CM

## 2020-05-08 DIAGNOSIS — M545 Low back pain, unspecified: Secondary | ICD-10-CM

## 2020-05-08 DIAGNOSIS — M5116 Intervertebral disc disorders with radiculopathy, lumbar region: Secondary | ICD-10-CM

## 2020-05-08 LAB — LIPID PANEL

## 2020-05-08 NOTE — Progress Notes (Signed)
Subjective:   HPI  Aaron Patel. is a 54 y.o. male who presents for Chief Complaint  Patient presents with  . Annual Exam    with fasting labs    Medical team: NP Truitt Merle, cardiology Podiatry, Dr. Velora Heckler Dr. Rutherford Guys, ophthalmology Dentist Dr. Alphonzo Severance, orthopedics Dr. Baird Lyons, pulmonology Dr. Zenovia Jarred, GI [name], MD and Dorothea Ogle, PA-C here  Concerns: Been having some left sided back pains.  Saw Dr. Redmond School for this recently, had updated xray.    Reviewed their medical, surgical, family, social, medication, and allergy history and updated chart as appropriate.  Past Medical History:  Diagnosis Date  . Chest pain    cath 12/31/10: post AV CFX < 10% (no significant CAD), EF 60%   . Family history of ischemic heart disease   . GERD (gastroesophageal reflux disease)    intermittent  . HLD (hyperlipidemia)   . Hx of cardiovascular stress test 11/2016   normal exercise stress test  . Hx of echocardiogram Aug 2012   Normal  . Lipoma    Dr. Greer Pickerel, consult 2013, pending surgery  . Obesity   . Peyronie disease    Urology consult prior    Past Surgical History:  Procedure Laterality Date  . CARDIAC CATHETERIZATION  12/2010   Dr. Marigene Ehlers   . COLONOSCOPY  04/2017   normal, Dr. Zenovia Jarred  . LIPOMA EXCISION N/A 01/03/2016   Procedure: EXCISION LOWER BACK LIPOMA;  Surgeon: Greer Pickerel, MD;  Location: [facility];  Service: General;  Laterality: N/A;  . stye surgery      Family History  Problem Relation Age of Onset  . Asthma Mother   . Mental illness Mother   . Heart disease Mother 64       died of CHF  . Arthritis Father   . Diabetes Father   . Heart disease Father 21       heart transplant, died of heart disease 5 years later  . Hypertension Brother   . Arthritis Paternal Aunt   . Diabetes Paternal Aunt   . Arthritis Paternal Uncle   . Diabetes Paternal Uncle   . Clotting disorder Maternal Aunt   . Colon cancer  Maternal Aunt   . Cancer Maternal Grandmother   . Stroke Neg Hx      Current Outpatient Medications:  .  lovastatin (MEVACOR) 40 MG tablet, TAKE 1 TABLET BY MOUTH AT BEDTIME, Disp: 30 tablet, Rfl: 0  Allergies  Allergen Reactions  . Iodinated Diagnostic Agents Hives and Rash       Review of Systems Constitutional: -fever, -chills, -sweats, -unexpected weight change, -decreased appetite, -fatigue Allergy: -sneezing, -itching, -congestion Dermatology: -changing moles, --rash, -lumps ENT: -runny nose, -ear pain, -sore throat, -hoarseness, -sinus pain, -teeth pain, - ringing in ears, -hearing loss, -nosebleeds Cardiology: -chest pain, -palpitations, -swelling, -difficulty breathing when lying flat, -waking up short of breath Respiratory: -cough, -shortness of breath, -difficulty breathing with exercise or exertion, -wheezing, -coughing up blood Gastroenterology: -abdominal pain, -nausea, -vomiting, -diarrhea, -constipation, -blood in stool, -changes in bowel movement, -difficulty swallowing or eating Hematology: -bleeding, -bruising  Musculoskeletal: -joint aches, -muscle aches, -joint swelling, +back pain, -neck pain, -cramping, -changes in gait Ophthalmology: denies vision changes, eye redness, itching, discharge Urology: -burning with urination, -difficulty urinating, -blood in urine, -urinary frequency, -urgency, -incontinence Neurology: -headache, -weakness, -tingling, -numbness, -memory loss, -falls, -dizziness Psychology: -depressed mood, -agitation, -sleep problems     Objective:   BP 110/80  Pulse 72   Ht 6' (1.829 m)   Wt 230 lb 12.8 oz (104.7 kg)   SpO2 97%   BMI 31.30 kg/m   Wt Readings from Last 3 Encounters:  05/08/20 230 lb 12.8 oz (104.7 kg)  04/29/20 233 lb 3.2 oz (105.8 kg)  11/20/19 230 lb 6.4 oz (104.5 kg)   General appearance: alert, no distress, WD/WN, African American male Skin: tattoo brand scar on left upper arm, chinese characters/words tattoo  on right upper arm, few scattered benign lesions HEENT: normocephalic, conjunctiva/corneas normal, sclerae anicteric, PERRLA, EOMi, nares patent, no discharge or erythema, pharynx normal Oral cavity: MMM, tongue normal, teeth normal Neck: supple, no lymphadenopathy, no thyromegaly, no masses, normal ROM, no bruits Chest: non tender, normal shape and expansion Heart: RRR, normal S1, S2, no murmurs Lungs: CTA bilaterally, no wheezes, rhonchi, or rales Abdomen: +bs, soft, non tender, non distended, no masses, no hepatomegaly, no splenomegaly, no bruits Back: lower back surgical scar, otherwise non tender, normal ROM, no scoliosis Musculoskeletal: upper extremities non tender, no obvious deformity, normal ROM throughout, lower extremities non tender, no obvious deformity, normal ROM throughout Extremities: no edema, no cyanosis, no clubbing Pulses: 2+ symmetric, upper and lower extremities, normal cap refill Neurological: alert, oriented x 3, CN2-12 intact, strength normal upper extremities and lower extremities, sensation normal throughout, DTRs 2+ throughout, no cerebellar signs, gait normal Psychiatric: normal affect, behavior normal, pleasant  GU: normal male external genitalia,circumcised, nontender, no masses, no hernia, no lymphadenopathy Rectal: anus normal tone, prostate WNL   Assessment and Plan :    Encounter Diagnoses  Name Primary?  . Encounter for health maintenance examination in adult Yes  . Hyperlipidemia, unspecified hyperlipidemia type   . Screening for prostate cancer   . Gastroesophageal reflux disease without esophagitis   . Radiculopathy due to lumbar intervertebral disc disorder   . Peyronie's disease   . Chronic bilateral low back pain, unspecified whether sciatica present   . Family history of heart disease in male family member before age 68   . Lipoma of back   . Class 1 obesity with serious comorbidity and body mass index (BMI) of 32.0 to 32.9 in adult,  unspecified obesity type   . Screen for STD (sexually transmitted disease)     Physical exam - discussed and counseled on healthy lifestyle, diet, exercise, preventative care, vaccinations, sick and well care, proper use of emergency dept and after hours care, and addressed their concerns.    Health screening: See your eye doctor yearly for routine vision care. See your dentist yearly for routine dental care including hygiene visits twice yearly.  I reviewed his 11/2016 cardiac stress test that was normal I reviewed his 04/2017 colonoscopy that was normal  Updated PSA today, discussed risks/benefits of testing  Vaccinations: Counseled on the following vaccines:  influenza Shingles He is up to date on Tetanus and Covid testing  Shingles vaccine:  I recommend you have a shingles vaccine to help prevent shingles or herpes zoster outbreak.   Please call your insurer to inquire about coverage for the Shingrix vaccine given in 2 doses.   Some insurers cover this vaccine after age 71, some cover this after age 76.  If your insurer covers this, then call to schedule appointment to have this vaccine here.  Other significant issues discussed: Heart disease screening: Reviewed 10/2019 cardiology notes.  His risk factors for heart disease include family history, hyperlipidemia.  She advised coronary calcium score.  Calcium score zero on 10/12/2019.  He is compliant with statin.  history of remote catheterization with no significant CAD.   Obesity - counseled on strategies to help lose weight  hyperlipidemia - compliant with statin, labs today fasting  Chronic back pain - reviewed recent T and L spine films.  Moderate and progressively worsening DDD.   There was moderate stool as well.  He will try some miralax in the short term.  Consider PT vs ortho f/u.   Eragon was seen today for annual exam.  Diagnoses and all orders for this visit:  Encounter for health maintenance examination in adult -      Lipid panel -     Comprehensive metabolic panel -     PSA -     HIV Antibody (routine testing w rflx) -     RPR -     Hepatitis C antibody -     GC/Chlamydia Probe Amp -     CBC  Hyperlipidemia, unspecified hyperlipidemia type -     Lipid panel  Screening for prostate cancer -     PSA  Gastroesophageal reflux disease without esophagitis  Radiculopathy due to lumbar intervertebral disc disorder  Peyronie's disease  Chronic bilateral low back pain, unspecified whether sciatica present  Family history of heart disease in male family member before age 28  Lipoma of back  Class 1 obesity with serious comorbidity and body mass index (BMI) of 32.0 to 32.9 in adult, unspecified obesity type  Screen for STD (sexually transmitted disease) -     HIV Antibody (routine testing w rflx) -     RPR -     Hepatitis C antibody -     GC/Chlamydia Probe Amp   Follow-up pending labs, yearly for physical

## 2020-05-09 ENCOUNTER — Other Ambulatory Visit: Payer: Self-pay | Admitting: Medical

## 2020-05-09 DIAGNOSIS — Z8249 Family history of ischemic heart disease and other diseases of the circulatory system: Secondary | ICD-10-CM

## 2020-05-09 DIAGNOSIS — E785 Hyperlipidemia, unspecified: Secondary | ICD-10-CM

## 2020-05-09 LAB — CBC
Hematocrit: 42.3 % (ref 37.5–51.0)
Hemoglobin: 14.6 g/dL (ref 13.0–17.7)
MCH: 30.6 pg (ref 26.6–33.0)
MCHC: 34.5 g/dL (ref 31.5–35.7)
MCV: 89 fL (ref 79–97)
Platelets: 340 10*3/uL (ref 150–450)
RBC: 4.77 x10E6/uL (ref 4.14–5.80)
RDW: 12.9 % (ref 11.6–15.4)
WBC: 6.5 10*3/uL (ref 3.4–10.8)

## 2020-05-09 LAB — COMPREHENSIVE METABOLIC PANEL
ALT: 24 IU/L (ref 0–44)
AST: 20 IU/L (ref 0–40)
Albumin/Globulin Ratio: 1.9 (ref 1.2–2.2)
Albumin: 4.7 g/dL (ref 3.8–4.9)
Alkaline Phosphatase: 104 IU/L (ref 44–121)
BUN/Creatinine Ratio: 15 (ref 9–20)
BUN: 15 mg/dL (ref 6–24)
Bilirubin Total: 0.6 mg/dL (ref 0.0–1.2)
CO2: 26 mmol/L (ref 20–29)
Calcium: 9.2 mg/dL (ref 8.7–10.2)
Chloride: 100 mmol/L (ref 96–106)
Creatinine, Ser: 1.03 mg/dL (ref 0.76–1.27)
GFR calc Af Amer: 95 mL/min/{1.73_m2} (ref >59)
GFR calc non Af Amer: 83 mL/min/{1.73_m2} (ref >59)
Globulin, Total: 2.5 g/dL (ref 1.5–4.5)
Glucose: 91 mg/dL (ref 65–99)
Potassium: 4.6 mmol/L (ref 3.5–5.2)
Sodium: 138 mmol/L (ref 134–144)
Total Protein: 7.2 g/dL (ref 6.0–8.5)

## 2020-05-09 LAB — LIPID PANEL
Chol/HDL Ratio: 3.6 ratio (ref 0.0–5.0)
Cholesterol, Total: 181 mg/dL (ref 100–199)
HDL: 50 mg/dL (ref >39)
LDL Chol Calc (NIH): 117 mg/dL — ABNORMAL HIGH (ref 0–99)
Triglycerides: 74 mg/dL (ref 0–149)
VLDL Cholesterol Cal: 14 mg/dL (ref 5–40)

## 2020-05-09 LAB — GC/CHLAMYDIA PROBE AMP
Chlamydia trachomatis, NAA: NEGATIVE
Neisseria Gonorrhoeae by PCR: NEGATIVE

## 2020-05-09 LAB — HEPATITIS C ANTIBODY: Hep C Virus Ab: 0.2 s/co ratio (ref 0.0–0.9)

## 2020-05-09 LAB — PSA: Prostate Specific Ag, Serum: 0.9 ng/mL (ref 0.0–4.0)

## 2020-05-09 LAB — RPR: RPR Ser Ql: NONREACTIVE

## 2020-05-09 LAB — HIV ANTIBODY (ROUTINE TESTING W REFLEX): HIV Screen 4th Generation wRfx: NONREACTIVE

## 2020-05-09 MED ORDER — LOVASTATIN 40 MG PO TABS: 40.0000 mg | ORAL_TABLET | Freq: Every day | ORAL | 3 refills | Status: DC

## 2020-05-27 ENCOUNTER — Ambulatory Visit: Payer: BC Managed Care – PPO | Admitting: Orthopedic Surgery

## 2020-06-04 ENCOUNTER — Telehealth (INDEPENDENT_AMBULATORY_CARE_PROVIDER_SITE_OTHER): Payer: BC Managed Care – PPO | Admitting: Family Medicine

## 2020-06-04 ENCOUNTER — Other Ambulatory Visit: Payer: Self-pay

## 2020-06-04 ENCOUNTER — Encounter: Payer: Self-pay | Admitting: Family Medicine

## 2020-06-04 VITALS — Temp 97.7°F | Wt 222.0 lb

## 2020-06-04 DIAGNOSIS — J209 Acute bronchitis, unspecified: Secondary | ICD-10-CM | POA: Diagnosis not present

## 2020-06-04 MED ORDER — AMOXICILLIN-POT CLAVULANATE 875-125 MG PO TABS: 1.0000 | ORAL_TABLET | Freq: Two times a day (BID) | ORAL | 0 refills | Status: DC

## 2020-06-04 NOTE — Progress Notes (Signed)
   Subjective:    Patient ID: Aaron Human., male    DOB: 1966-03-05, 54 y.o.   MRN: [id number]  HPI I connected with  Lovelace Cerveny. on 06/04/20 by a video enabled telemedicine application and verified that I am speaking with the correct person using two identifiers.  [employer] used. [id number]   I discussed the limitations of evaluation and management by telemedicine. The patient expressed understanding and agreed to proceed. He has a 3-week history of difficulty with slight sore throat, chest congestion, cough that is nonproductive. No fever, chills, shortness of breath, earache. He is up-to-date on his immunizations including Covid and has had a negative Covid test on the 27th.   Review of Systems     Objective:   Physical Exam Alert and in no distress with normal breathing pattern.       Assessment & Plan:  Acute bronchitis, unspecified organism - Plan: amoxicillin-clavulanate (AUGMENTIN) 875-125 MG tablet He will call if he continues to have trouble. 20 minutes spent in reviewing his medical record, history, evaluation and treatment as well as documentation.

## 2020-06-12 ENCOUNTER — Ambulatory Visit: Payer: BC Managed Care – PPO | Admitting: Orthopedic Surgery

## 2020-07-17 ENCOUNTER — Encounter: Payer: Self-pay | Admitting: Family Medicine

## 2020-07-17 ENCOUNTER — Other Ambulatory Visit: Payer: Self-pay

## 2020-07-17 ENCOUNTER — Telehealth (INDEPENDENT_AMBULATORY_CARE_PROVIDER_SITE_OTHER): Payer: BC Managed Care – PPO | Admitting: Family Medicine

## 2020-07-17 VITALS — BP 124/86 | Wt 222.0 lb

## 2020-07-17 DIAGNOSIS — J0100 Acute maxillary sinusitis, unspecified: Secondary | ICD-10-CM

## 2020-07-17 MED ORDER — AMOXICILLIN-POT CLAVULANATE 875-125 MG PO TABS: 1.0000 | ORAL_TABLET | Freq: Two times a day (BID) | ORAL | 0 refills | Status: DC

## 2020-07-17 NOTE — Progress Notes (Signed)
   Subjective:    Patient ID: Aaron Patel., male    DOB: 06-16-1965, 55 y.o.   MRN: [id number]  HPI Documentation for virtual audio and video telecommunications through [place] encounter: The patient was located at home. 2 patient identifiers used.  The provider was located in the office. The patient did consent to this visit and is aware of possible charges through their insurance for this visi The other persons participating in this telemedicine service were none. Time spent on call was 5 minutes and in review of previous records >20 minutes total for counseling and coordination of care. This virtual service is not related to other E/M service within previous 7 days. He states that on Sunday he developed sinus congestion, headache, nasal congestion but no fever, chills, postnasal drainage, smell or taste change.  He did get a Covid PCR test done which was negative.  He states that the symptoms are similar to a case of sinusitis that he had in the recent past.  Review of Systems     Objective:   Physical Exam Alert and in no distress otherwise not examined       Assessment & Plan:  Acute non-recurrent maxillary sinusitis - Plan: amoxicillin-clavulanate (AUGMENTIN) 875-125 MG tablet I explained that I thought this was related to sinus infection similar to when he had in the past.  He will call for further difficulty.
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
## 2020-11-29 ENCOUNTER — Ambulatory Visit: Payer: 59 | Admitting: Physician Assistant

## 2020-12-18 ENCOUNTER — Ambulatory Visit (INDEPENDENT_AMBULATORY_CARE_PROVIDER_SITE_OTHER): Payer: BC Managed Care – PPO

## 2020-12-18 ENCOUNTER — Other Ambulatory Visit: Payer: Self-pay

## 2020-12-18 DIAGNOSIS — Z23 Encounter for immunization: Secondary | ICD-10-CM | POA: Diagnosis not present

## 2021-02-19 ENCOUNTER — Ambulatory Visit: Payer: BC Managed Care – PPO | Admitting: Family Medicine

## 2021-02-19 ENCOUNTER — Encounter: Payer: Self-pay | Admitting: Family Medicine

## 2021-02-19 ENCOUNTER — Other Ambulatory Visit: Payer: Self-pay

## 2021-02-19 VITALS — BP 124/80 | HR 77 | Temp 98.5°F | Wt 234.2 lb

## 2021-02-19 DIAGNOSIS — L255 Unspecified contact dermatitis due to plants, except food: Secondary | ICD-10-CM | POA: Diagnosis not present

## 2021-02-19 MED ORDER — TRIAMCINOLONE ACETONIDE 0.1 % EX CREA: 1.0000 "application " | TOPICAL_CREAM | Freq: Two times a day (BID) | CUTANEOUS | 0 refills | Status: DC

## 2021-02-19 NOTE — Progress Notes (Signed)
   Subjective:    Patient ID: Aaron Grim., male    DOB: 1965/08/31, 55 y.o.   MRN: [id number]  HPI Chief Complaint  Patient presents with   other    Rash on arm started Saturday and arm was swollen lt. Arm and one spot on back of lt. leg   He is here with complaints of a 5 to 6-day pruritic rash on his left arm and the back of his left leg.  He works in Teacher, adult education care and thinks he came in contact with a plant, possibly poison ivy.  Denies fever, chills, nausea, vomiting. States he has been using calamine lotion and the area has improved slightly.    Review of Systems Pertinent positives and negatives in the history of present illness.     Objective:   Physical Exam BP 124/80   Pulse 77   Temp 98.5 F (36.9 C)   Wt 234 lb 3.2 oz (106.2 kg)   BMI 31.76 kg/m   He has a linear pruritic erythematous rash on his anterior and posterior left forearm.  Mostly papules with small vesicles.  No surrounding erythema or fluctuance.  No drainage, no sign of infection.      Assessment & Plan:  Contact dermatitis due to plants, except food, unspecified contact dermatitis type  No sign of infection currently.  I advised him to take a picture of the area and keep an eye on it over the next few days.  Triamcinolone cream prescribed to use for 1 week.  He will call back if he needs a refill since the area is quite large.  Zyrtec samples provided.  Advised him to use cool compresses for itching.  May take Benadryl at bedtime if needed.  Follow-up if any signs of infection or worsening symptoms.

## 2021-05-06 ENCOUNTER — Other Ambulatory Visit: Payer: Self-pay

## 2021-05-06 ENCOUNTER — Ambulatory Visit: Payer: Self-pay

## 2021-05-06 ENCOUNTER — Other Ambulatory Visit: Payer: Self-pay | Admitting: Occupational Medicine

## 2021-05-06 DIAGNOSIS — M79671 Pain in right foot: Secondary | ICD-10-CM

## 2021-06-05 ENCOUNTER — Encounter: Payer: BC Managed Care – PPO | Admitting: Medical

## 2021-06-10 ENCOUNTER — Encounter: Payer: Self-pay | Admitting: Medical

## 2021-06-10 ENCOUNTER — Ambulatory Visit (INDEPENDENT_AMBULATORY_CARE_PROVIDER_SITE_OTHER): Payer: BC Managed Care – PPO | Admitting: Medical

## 2021-06-10 ENCOUNTER — Other Ambulatory Visit: Payer: Self-pay

## 2021-06-10 VITALS — BP 112/82 | HR 71 | Ht 70.5 in | Wt 229.2 lb

## 2021-06-10 DIAGNOSIS — Z23 Encounter for immunization: Secondary | ICD-10-CM | POA: Diagnosis not present

## 2021-06-10 DIAGNOSIS — E785 Hyperlipidemia, unspecified: Secondary | ICD-10-CM | POA: Diagnosis not present

## 2021-06-10 DIAGNOSIS — Z125 Encounter for screening for malignant neoplasm of prostate: Secondary | ICD-10-CM

## 2021-06-10 DIAGNOSIS — Z0000 Encounter for general adult medical examination without abnormal findings: Secondary | ICD-10-CM | POA: Diagnosis not present

## 2021-06-10 DIAGNOSIS — R140 Abdominal distension (gaseous): Secondary | ICD-10-CM

## 2021-06-10 DIAGNOSIS — K5900 Constipation, unspecified: Secondary | ICD-10-CM

## 2021-06-10 DIAGNOSIS — Z8249 Family history of ischemic heart disease and other diseases of the circulatory system: Secondary | ICD-10-CM | POA: Diagnosis not present

## 2021-06-10 DIAGNOSIS — Z1211 Encounter for screening for malignant neoplasm of colon: Secondary | ICD-10-CM

## 2021-06-10 MED ORDER — POLYETHYLENE GLYCOL 3350 17 GM/SCOOP PO POWD: 17.0000 g | ORAL | 0 refills | Status: DC

## 2021-06-10 NOTE — Progress Notes (Signed)
Subjective:   HPI  Aaron Patel. is a 56 y.o. male who presents for Chief Complaint  Patient presents with   Annual Exam   Medical team: NP Truitt Merle, cardiology Podiatry, Dr. Velora Heckler Dr. Rutherford Guys, ophthalmology Dentist Dr. Alphonzo Severance, orthopedics Dr. Baird Lyons, pulmonology Dr. Zenovia Jarred, GI [name], MD and Dorothea Ogle, PA-C here  Concerns: Only recent concern is some bowel changes through the holidays.  Normally has a bowel movement daily but over the last few weeks is having little trouble with bowel irregularities bloating and constipation  Compliant with medication for cholesterol  He has questions about heart surveillance, heart disease screening   Reviewed their medical, surgical, family, social, medication, and allergy history and updated chart as appropriate.  Past Medical History:  Diagnosis Date   Chest pain    cath 12/31/10: post AV CFX < 10% (no significant CAD), EF 60%    Family history of ischemic heart disease    GERD (gastroesophageal reflux disease)    intermittent   HLD (hyperlipidemia)    Hx of cardiovascular stress test 11/2016   normal exercise stress test   Hx of echocardiogram Aug 2012   Normal   Lipoma    Dr. Greer Pickerel, consult 2013, pending surgery   Obesity    Peyronie disease    Urology consult prior    Past Surgical History:  Procedure Laterality Date   CARDIAC CATHETERIZATION  12/2010   Dr. Marigene Ehlers    COLONOSCOPY  04/2017   normal, Dr. Zenovia Jarred   LIPOMA EXCISION N/A 01/03/2016   Procedure: EXCISION LOWER BACK LIPOMA;  Surgeon: Greer Pickerel, MD;  Location: [facility];  Service: General;  Laterality: N/A;   stye surgery      Family History  Problem Relation Age of Onset   Asthma Mother    Mental illness Mother    Heart disease Mother 78       died of CHF   Arthritis Father    Diabetes Father    Heart disease Father 64       heart transplant, died of heart disease 5 years later    Hypertension Brother    Arthritis Paternal Aunt    Diabetes Paternal Aunt    Arthritis Paternal Uncle    Diabetes Paternal Uncle    Clotting disorder Maternal Aunt    Colon cancer Maternal Aunt    Cancer Maternal Grandmother    Stroke Neg Hx      Current Outpatient Medications:    lovastatin (MEVACOR) 40 MG tablet, Take 1 tablet (40 mg total) by mouth at bedtime., Disp: 90 tablet, Rfl: 3   polyethylene glycol powder (GLYCOLAX/MIRALAX) 17 GM/SCOOP powder, Take 17 g by mouth every other day., Disp: 116 g, Rfl: 0  Allergies  Allergen Reactions   Iodinated Contrast Media Hives and Rash       Review of Systems Constitutional: -fever, -chills, -sweats, -unexpected weight change, -decreased appetite, -fatigue Allergy: -sneezing, -itching, -congestion Dermatology: -changing moles, --rash, -lumps ENT: -runny nose, -ear pain, -sore throat, -hoarseness, -sinus pain, -teeth pain, - ringing in ears, -hearing loss, -nosebleeds Cardiology: -chest pain, -palpitations, -swelling, -difficulty breathing when lying flat, -waking up short of breath Respiratory: -cough, -shortness of breath, -difficulty breathing with exercise or exertion, -wheezing, -coughing up blood Gastroenterology: -abdominal pain, -nausea, -vomiting, -diarrhea, +constipation, -blood in stool, -changes in bowel movement, -difficulty swallowing or eating Hematology: -bleeding, -bruising  Musculoskeletal: -joint aches, -muscle aches, -joint swelling, -back pain, -neck pain, -  cramping, -changes in gait Ophthalmology: denies vision changes, eye redness, itching, discharge Urology: -burning with urination, -difficulty urinating, -blood in urine, -urinary frequency, -urgency, -incontinence Neurology: -headache, -weakness, -tingling, -numbness, -memory loss, -falls, -dizziness Psychology: -depressed mood, -agitation, -sleep problems     Objective:   BP 112/82 (BP Location: Right Arm, Patient Position: Sitting)    Pulse 71    Ht 5'  10.5" (1.791 m)    Wt 229 lb 3.2 oz (104 kg)    SpO2 97%    BMI 32.42 kg/m   Wt Readings from Last 3 Encounters:  06/10/21 229 lb 3.2 oz (104 kg)  02/19/21 234 lb 3.2 oz (106.2 kg)  07/17/20 222 lb (100.7 kg)   General appearance: alert, no distress, WD/WN, African American male Skin: tattoo brand scar on left upper arm, chinese characters/words tattoo on right upper arm, few scattered benign lesions HEENT: normocephalic, conjunctiva/corneas normal, sclerae anicteric, PERRLA, EOMi, nares patent, no discharge or erythema, pharynx normal Oral cavity: MMM, tongue normal, teeth normal Neck: supple, no lymphadenopathy, no thyromegaly, no masses, normal ROM, no bruits Chest: non tender, normal shape and expansion Heart: RRR, normal S1, S2, no murmurs Lungs: CTA bilaterally, no wheezes, rhonchi, or rales Abdomen: +bs, soft, mild central tenderness, otherwise non tender, non distended, no masses, no hepatomegaly, no splenomegaly, no bruits Back: lower back surgical scar, otherwise non tender, normal ROM, no scoliosis Musculoskeletal: upper extremities non tender, no obvious deformity, normal ROM throughout, lower extremities non tender, no obvious deformity, normal ROM throughout Extremities: no edema, no cyanosis, no clubbing Pulses: 2+ symmetric, upper and lower extremities, normal cap refill Neurological: alert, oriented x 3, CN2-12 intact, strength normal upper extremities and lower extremities, sensation normal throughout, DTRs 2+ throughout, no cerebellar signs, gait normal Psychiatric: normal affect, behavior normal, pleasant  GU: normal male external genitalia,circumcised, nontender, no masses, no hernia, no lymphadenopathy Rectal: anus normal tone, prostate mildly enlarged, no nodules   Assessment and Plan :    Encounter Diagnoses  Name Primary?   Encounter for health maintenance examination in adult Yes   Need for COVID-19 vaccine    Family history of heart disease in male family  member before age 33    Hyperlipidemia, unspecified hyperlipidemia type    Screening for prostate cancer    Screen for colon cancer    Abdominal bloating    Constipation, unspecified constipation type     Physical exam - discussed and counseled on healthy lifestyle, diet, exercise, preventative care, vaccinations, sick and well care, proper use of emergency dept and after hours care, and addressed their concerns.    Health screening: See your eye doctor yearly for routine vision care. See your dentist yearly for routine dental care including hygiene visits twice yearly.  I reviewed his 11/2016 cardiac stress test that was normal I reviewed his 04/2017 colonoscopy that was normal I reviewed 2021 CT coronary score of zero.  Updated PSA today, discussed risks/benefits of testing  Vaccinations: Counseled on the following vaccines:  influenza Shingles Due for Td 09/2021.  He will return at that time for Td vaccine  Counseled on Covid virus vaccine.  .  Covid vaccine given after consent obtained.   Shingles vaccine:  I recommend you have a shingles vaccine to help prevent shingles or herpes zoster outbreak.   Please call your insurer to inquire about coverage for the Shingrix vaccine given in 2 doses.   Some insurers cover this vaccine after age 45, some cover this after age 71.  If your insurer covers this, then call to schedule appointment to have this vaccine here.   Other significant issues discussed: Heart disease screening: Reviewed 10/2019 cardiology notes.  His risk factors for heart disease include family history, hyperlipidemia.  Reviewed normal 2021 coronary calcium score.  Calcium score zero on 10/12/2019.  He is compliant with statin.  history of remote catheterization with no significant CAD.    Obesity - continue efforts to lose weight through health diet and exercise  hyperlipidemia - compliant with statin, labs today fasting  Constipation - likely due to changes in diet  over the holidays.  Begin samples of miralax work on good hydration and fiber intake, about heavy foods, large portions.  If not improving within 2 weeks, then recheck  Adhvik was seen today for annual exam.  Diagnoses and all orders for this visit:  Encounter for health maintenance examination in adult -     Comprehensive metabolic panel -     CBC -     Lipid panel -     PSA -     Urinalysis, Routine w reflex microscopic  Need for COVID-19 vaccine -     Pension scheme manager  Family history of heart disease in male family member before age 27  Hyperlipidemia, unspecified hyperlipidemia type -     Lipid panel  Screening for prostate cancer -     PSA -     Urinalysis, Routine w reflex microscopic  Screen for colon cancer -     Fecal occult blood, imunochemical([facility])  Abdominal bloating  Constipation, unspecified constipation type  Other orders -     polyethylene glycol powder (GLYCOLAX/MIRALAX) 17 GM/SCOOP powder; Take 17 g by mouth every other day.   Follow-up pending labs, yearly for physical

## 2021-06-11 ENCOUNTER — Other Ambulatory Visit: Payer: Self-pay | Admitting: Medical

## 2021-06-11 DIAGNOSIS — Z8249 Family history of ischemic heart disease and other diseases of the circulatory system: Secondary | ICD-10-CM

## 2021-06-11 DIAGNOSIS — E785 Hyperlipidemia, unspecified: Secondary | ICD-10-CM

## 2021-06-11 LAB — COMPREHENSIVE METABOLIC PANEL
ALT: 21 IU/L (ref 0–44)
AST: 20 IU/L (ref 0–40)
Albumin/Globulin Ratio: 2 (ref 1.2–2.2)
Albumin: 4.6 g/dL (ref 3.8–4.9)
Alkaline Phosphatase: 119 IU/L (ref 44–121)
BUN/Creatinine Ratio: 13 (ref 9–20)
BUN: 14 mg/dL (ref 6–24)
Bilirubin Total: 0.3 mg/dL (ref 0.0–1.2)
CO2: 25 mmol/L (ref 20–29)
Calcium: 9.1 mg/dL (ref 8.7–10.2)
Chloride: 100 mmol/L (ref 96–106)
Creatinine, Ser: 1.08 mg/dL (ref 0.76–1.27)
Globulin, Total: 2.3 g/dL (ref 1.5–4.5)
Glucose: 91 mg/dL (ref 70–99)
Potassium: 4.5 mmol/L (ref 3.5–5.2)
Sodium: 137 mmol/L (ref 134–144)
Total Protein: 6.9 g/dL (ref 6.0–8.5)
eGFR: 82 mL/min/{1.73_m2} (ref >59)

## 2021-06-11 LAB — URINALYSIS, ROUTINE W REFLEX MICROSCOPIC
Bilirubin, UA: NEGATIVE
Glucose, UA: NEGATIVE
Ketones, UA: NEGATIVE
Leukocytes,UA: NEGATIVE
Nitrite, UA: NEGATIVE
Protein,UA: NEGATIVE
RBC, UA: NEGATIVE
Specific Gravity, UA: 1.025 (ref 1.005–1.030)
Urobilinogen, Ur: 0.2 mg/dL (ref 0.2–1.0)
pH, UA: 5.5 (ref 5.0–7.5)

## 2021-06-11 LAB — CBC
Hematocrit: 46.4 % (ref 37.5–51.0)
Hemoglobin: 16.1 g/dL (ref 13.0–17.7)
MCH: 30.3 pg (ref 26.6–33.0)
MCHC: 34.7 g/dL (ref 31.5–35.7)
MCV: 87 fL (ref 79–97)
Platelets: 336 10*3/uL (ref 150–450)
RBC: 5.32 x10E6/uL (ref 4.14–5.80)
RDW: 12.4 % (ref 11.6–15.4)
WBC: 6.6 10*3/uL (ref 3.4–10.8)

## 2021-06-11 LAB — LIPID PANEL
Chol/HDL Ratio: 3.7 ratio (ref 0.0–5.0)
Cholesterol, Total: 180 mg/dL (ref 100–199)
HDL: 49 mg/dL (ref >39)
LDL Chol Calc (NIH): 112 mg/dL — ABNORMAL HIGH (ref 0–99)
Triglycerides: 103 mg/dL (ref 0–149)
VLDL Cholesterol Cal: 19 mg/dL (ref 5–40)

## 2021-06-11 LAB — PSA: Prostate Specific Ag, Serum: 0.9 ng/mL (ref 0.0–4.0)

## 2021-06-11 MED ORDER — LOVASTATIN 40 MG PO TABS: 40.0000 mg | ORAL_TABLET | Freq: Every day | ORAL | 3 refills | Status: DC

## 2021-06-12 ENCOUNTER — Encounter: Payer: BC Managed Care – PPO | Admitting: Medical

## 2021-09-08 ENCOUNTER — Institutional Professional Consult (permissible substitution): Payer: BC Managed Care – PPO | Admitting: Medical

## 2021-09-09 ENCOUNTER — Ambulatory Visit: Payer: BC Managed Care – PPO | Admitting: Medical

## 2021-09-09 VITALS — BP 122/80 | HR 72 | Temp 97.2°F | Wt 232.6 lb

## 2021-09-09 DIAGNOSIS — R430 Anosmia: Secondary | ICD-10-CM | POA: Insufficient documentation

## 2021-09-09 DIAGNOSIS — R5383 Other fatigue: Secondary | ICD-10-CM | POA: Diagnosis not present

## 2021-09-09 DIAGNOSIS — J3489 Other specified disorders of nose and nasal sinuses: Secondary | ICD-10-CM | POA: Diagnosis not present

## 2021-09-09 DIAGNOSIS — Z1329 Encounter for screening for other suspected endocrine disorder: Secondary | ICD-10-CM

## 2021-09-09 DIAGNOSIS — E569 Vitamin deficiency, unspecified: Secondary | ICD-10-CM | POA: Diagnosis not present

## 2021-09-09 DIAGNOSIS — Z8616 Personal history of COVID-19: Secondary | ICD-10-CM

## 2021-09-09 MED ORDER — AMOXICILLIN-POT CLAVULANATE 875-125 MG PO TABS: 1.0000 | ORAL_TABLET | Freq: Two times a day (BID) | ORAL | 0 refills | Status: DC

## 2021-09-09 NOTE — Progress Notes (Signed)
Subjective: ? Said Aaron Patel. is a 56 y.o. male who presents for ?Chief Complaint  ?Patient presents with  ? longhauler covid  ?  Long hauler covid, had covid October 2020 since then having headaches, having some issues with still tasting food. Having a lot fatigue. Has missed 10 days of work so far this year due to the symptoms  ?   ?Here for fatigue concerns.    Ever since having covid in 03/2019, has had headaches, still has problems with taste and smell ?Has missed about 10 days this year for a variety of symptoms ? ?He notes that he has had some headaches, ongoing problems with loss of taste, lot of smell.  Will eat something and not taste, but then gets the taste later after eating.  Fatigue has been a major symptom ongoing, low energy.  Has also had chronic sinus pressure , feels like ongoing sinus infection.   No allergy problems, no runny nose, no sneezing, no itchy eyes.  Has missed work, about 10 days since beginning of this year.   ? ?No other aggravating or relieving factors.   ? ?No other c/o. ? ?Past Medical History:  ?Diagnosis Date  ? Chest pain   ? cath 12/31/10: post AV CFX < 10% (no significant CAD), EF 60%   ? Family history of ischemic heart disease   ? GERD (gastroesophageal reflux disease)   ? intermittent  ? HLD (hyperlipidemia)   ? Hx of cardiovascular stress test 11/2016  ? normal exercise stress test  ? Hx of echocardiogram Aug 2012  ? Normal  ? Lipoma   ? Dr. Greer Pickerel, consult 2013, pending surgery  ? Obesity   ? Peyronie disease   ? Urology consult prior  ? ?Current Outpatient Medications on File Prior to Visit  ?Medication Sig Dispense Refill  ? lovastatin (MEVACOR) 40 MG tablet Take 1 tablet (40 mg total) by mouth at bedtime. 90 tablet 3  ? ?No current facility-administered medications on file prior to visit.  ? ? ? ?The following portions of the patient's history were reviewed and updated as appropriate: allergies, current medications, past family history, past medical  history, past social history, past surgical history and problem list. ? ?ROS ?Otherwise as in subjective above ? ? ? ?Objective: ?BP 122/80   Pulse 72   Temp (!) 97.2 ?F (36.2 ?C)   Wt 232 lb 9.6 oz (105.5 kg)   BMI 32.90 kg/m?  ? ?Wt Readings from Last 3 Encounters:  ?09/09/21 232 lb 9.6 oz (105.5 kg)  ?06/10/21 229 lb 3.2 oz (104 kg)  ?02/19/21 234 lb 3.2 oz (106.2 kg)  ? ?General appearance: alert, no distress, well developed, well nourished ?HEENT: normocephalic, sclerae anicteric, conjunctiva pink and moist, TMs pearly, nares patent, no discharge or erythema, pharynx normal ?Oral cavity: MMM, no lesions ?Neck: supple, no lymphadenopathy, no thyromegaly, no masses, no bruits ?Heart: RRR, normal S1, S2, no murmurs ?Lungs: CTA bilaterally, no wheezes, rhonchi, or rales ?Pulses: 2+ radial pulses, 2+ pedal pulses, normal cap refill ?Ext: no edema ?Neuro: cn2-12 intact, nonfocal exam ? ? ? ?Assessment: ?Encounter Diagnoses  ?Name Primary?  ? Other fatigue Yes  ? Loss of smell   ? Vitamin deficiency   ? Sinus pressure   ? History of COVID-19   ? Screening for thyroid disorder   ? ? ? ?Plan: ?Discussed symptoms, concerns.   Will do round of antibiotic for suspected chronic sinusitis.  If this doesn't improve, consider head imaging/sinus  imaging ? ?Labs to further eval fatigue.  If labs normal, refer to neurology, possible ENT, possible sleep study ? ? ?[name] was seen today for longhauler covid. ? ?Diagnoses and all orders for this visit: ? ?Other fatigue ?-     TSH ?-     Testosterone ?-     Vitamin B12 ?-     VITAMIN D 25 Hydroxy (Vit-D Deficiency, Fractures) ? ?Loss of smell ? ?Vitamin deficiency ?-     Vitamin B12 ?-     VITAMIN D 25 Hydroxy (Vit-D Deficiency, Fractures) ? ?Sinus pressure ? ?History of COVID-19 ? ?Screening for thyroid disorder ?-     TSH ? ?Other orders ?-     amoxicillin-clavulanate (AUGMENTIN) 875-125 MG tablet; Take 1 tablet by mouth 2 (two) times daily. ? ? ?Follow up: pending labs ?

## 2021-09-10 ENCOUNTER — Other Ambulatory Visit: Payer: Self-pay | Admitting: Medical

## 2021-09-10 DIAGNOSIS — R519 Headache, unspecified: Secondary | ICD-10-CM

## 2021-09-10 DIAGNOSIS — R5383 Other fatigue: Secondary | ICD-10-CM

## 2021-09-10 DIAGNOSIS — Z8616 Personal history of COVID-19: Secondary | ICD-10-CM

## 2021-09-10 DIAGNOSIS — R430 Anosmia: Secondary | ICD-10-CM

## 2021-09-10 LAB — TSH: TSH: 1.91 u[IU]/mL (ref 0.450–4.500)

## 2021-09-10 LAB — VITAMIN B12: Vitamin B-12: 533 pg/mL (ref 232–1245)

## 2021-09-10 LAB — VITAMIN D 25 HYDROXY (VIT D DEFICIENCY, FRACTURES): Vit D, 25-Hydroxy: 19 ng/mL — ABNORMAL LOW (ref 30.0–100.0)

## 2021-09-10 LAB — TESTOSTERONE: Testosterone: 346 ng/dL (ref 264–916)

## 2021-09-10 MED ORDER — VITAMIN D 50 MCG (2000 UT) PO CAPS: 1.0000 | ORAL_CAPSULE | Freq: Every day | ORAL | 3 refills | Status: DC

## 2021-10-13 ENCOUNTER — Telehealth: Payer: Self-pay | Admitting: Medical

## 2021-10-13 NOTE — Telephone Encounter (Signed)
FMLA paperwork has been completed.  Please SCAN copy for [place].  Return form to patient or employer as requested.   ? ?Charge the customary fee for FMLA form completion.  ?

## 2022-02-11 ENCOUNTER — Encounter: Payer: Self-pay | Admitting: Internal Medicine

## 2022-03-09 ENCOUNTER — Telehealth: Payer: Self-pay | Admitting: Medical

## 2022-03-09 NOTE — Telephone Encounter (Signed)
Pt emailed form to be filled out put in your folder

## 2022-03-09 NOTE — Telephone Encounter (Signed)
Form completed & copied, called pt left message ready to be picked up & emailed to pt

## 2022-05-18 ENCOUNTER — Other Ambulatory Visit: Payer: Self-pay

## 2022-05-18 ENCOUNTER — Telehealth: Payer: Self-pay | Admitting: Medical

## 2022-05-18 DIAGNOSIS — E785 Hyperlipidemia, unspecified: Secondary | ICD-10-CM

## 2022-05-18 DIAGNOSIS — Z8249 Family history of ischemic heart disease and other diseases of the circulatory system: Secondary | ICD-10-CM

## 2022-05-18 MED ORDER — LOVASTATIN 40 MG PO TABS: 40.0000 mg | ORAL_TABLET | Freq: Every day | ORAL | 3 refills | Status: DC

## 2022-05-18 NOTE — Telephone Encounter (Signed)
Refill request  Lovastatin 40 mg #90  Last filled 02/19/22

## 2022-05-27 ENCOUNTER — Telehealth: Payer: BC Managed Care – PPO | Admitting: Family Medicine

## 2022-05-27 DIAGNOSIS — U071 COVID-19: Secondary | ICD-10-CM

## 2022-05-27 NOTE — Progress Notes (Signed)
   Subjective:    Patient ID: Aaron Grim., male    DOB: June 22, 1965, 56 y.o.   MRN: [id number]  HPI Documentation for virtual audio and video telecommunications through [place] encounter: The patient was located at home. 2 patient identifiers used.  The provider was located in the office. The patient did consent to this visit and is aware of possible charges through their insurance for this visit. The other persons participating in this telemedicine service were none. Time spent on call was 5 minutes and in review of previous records >15 minutes total for counseling and coordination of care. This virtual service is not related to other E/M service within previous 7 days.  He states that on Monday he developed a slight sore throat.  Tuesday he developed some fever and chills and did test last night and was positive.  He is now having difficulty with nasal congestion, headache and slight cough.  Review of Systems     Objective:   Physical Exam Alert and in no distress otherwise not examined       Assessment & Plan:  COVID-19 Discussed the fact that he needs to stay sequestered for another 5 days and as long as he gets better after that then he can wear a mask for another 5 days.  Discussed symptomatic treatment.  Discussed the fact that if he gets worsening fever, cough and shortness of breath let me know over the next 5 days and I will place him on a medication.  He does not need to do further testing.  He expressed understanding of all this.

## 2022-06-05 ENCOUNTER — Encounter: Payer: Self-pay | Admitting: Family Medicine

## 2022-06-05 ENCOUNTER — Telehealth: Payer: BC Managed Care – PPO | Admitting: Family Medicine

## 2022-06-05 DIAGNOSIS — R0981 Nasal congestion: Secondary | ICD-10-CM

## 2022-06-05 DIAGNOSIS — Z8616 Personal history of COVID-19: Secondary | ICD-10-CM

## 2022-06-05 NOTE — Progress Notes (Signed)
   Subjective:    Patient ID: Aaron Grim., male    DOB: 04-13-1966, 56 y.o.   MRN: [id number]  HPI Documentation for virtual audio and video telecommunications through [place] encounter: The patient was located at home. 2 patient identifiers used.  The provider was located in the office. The patient did consent to this visit and is aware of possible charges through their insurance for this visit.5 The other persons participating in this telemedicine service were none. Time spent on call was 5 minutes and in review of previous records >20 minutes total for counseling and coordination of care. This virtual service is not related to other E/M service within previous 7 days.  He was seen virtually on December 20 with a diagnosis of COVID.  Since then he has done well except for sinus pressure that he notes mainly at night.  He has been using an over-the-counter medication that has an antihistamine and decongestant which helps him sleep.  He also has some headache that seems to help.  He has had no sore throat, earache, fever, chills or coughing.  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  Review of Systems     Objective:   Physical Exam Alert and in no distress otherwise not examined       Assessment & Plan:  Nasal congestion  History of COVID-19 He seems to be responding fairly well to the OTC med to open them up and I will therefore have him continue to do that but also use Afrin nasal spray only at night through the weekend to see if that is enough to get him through this episode.  If not he will call for an appointment.

## 2022-06-11 ENCOUNTER — Ambulatory Visit (INDEPENDENT_AMBULATORY_CARE_PROVIDER_SITE_OTHER): Payer: BC Managed Care – PPO | Admitting: Medical

## 2022-06-11 ENCOUNTER — Encounter: Payer: Self-pay | Admitting: Medical

## 2022-06-11 VITALS — BP 124/76 | HR 76 | Ht 73.0 in | Wt 230.0 lb

## 2022-06-11 DIAGNOSIS — E785 Hyperlipidemia, unspecified: Secondary | ICD-10-CM

## 2022-06-11 DIAGNOSIS — Z23 Encounter for immunization: Secondary | ICD-10-CM

## 2022-06-11 DIAGNOSIS — Z0000 Encounter for general adult medical examination without abnormal findings: Secondary | ICD-10-CM | POA: Diagnosis not present

## 2022-06-11 DIAGNOSIS — Z8616 Personal history of COVID-19: Secondary | ICD-10-CM | POA: Diagnosis not present

## 2022-06-11 DIAGNOSIS — J3489 Other specified disorders of nose and nasal sinuses: Secondary | ICD-10-CM

## 2022-06-11 DIAGNOSIS — R5383 Other fatigue: Secondary | ICD-10-CM

## 2022-06-11 DIAGNOSIS — Z125 Encounter for screening for malignant neoplasm of prostate: Secondary | ICD-10-CM

## 2022-06-11 DIAGNOSIS — E569 Vitamin deficiency, unspecified: Secondary | ICD-10-CM

## 2022-06-11 DIAGNOSIS — R519 Headache, unspecified: Secondary | ICD-10-CM

## 2022-06-11 LAB — POCT URINALYSIS DIP (PROADVANTAGE DEVICE)
Bilirubin, UA: NEGATIVE
Blood, UA: NEGATIVE
Glucose, UA: NEGATIVE mg/dL
Ketones, POC UA: NEGATIVE mg/dL
Leukocytes, UA: NEGATIVE
Nitrite, UA: NEGATIVE
Protein Ur, POC: NEGATIVE mg/dL
Specific Gravity, Urine: 1.03
Urobilinogen, Ur: 0.2
pH, UA: 6 (ref 5.0–8.0)

## 2022-06-11 MED ORDER — AMOXICILLIN 875 MG PO TABS: 875.0000 mg | ORAL_TABLET | Freq: Two times a day (BID) | ORAL | 0 refills | Status: DC

## 2022-06-11 NOTE — Progress Notes (Signed)
Subjective:   HPI  Aaron Patel. is a 57 y.o. male who presents for Chief Complaint  Patient presents with   Annual Exam    Fasting annual exam.   Medical team: NP Truitt Merle, cardiology Podiatry, Dr. Velora Heckler Dr. Rutherford Guys, ophthalmology Dentist Dr. Alphonzo Severance, orthopedics Dr. Baird Lyons, pulmonology Dr. Zenovia Jarred, GI Kedron Uno, Camelia Eng, PA-C and Dorothea Ogle, PA-C here  Concerns: Just had recent covid infection prior to Christmas.  Has been having some morning headaches since covid.   Feels some sinus pressure, congestion.  Otherwise most symptoms resolved.  Curious about testosterone level, having some ongoing fatigue  Reviewed their medical, surgical, family, social, medication, and allergy history and updated chart as appropriate.  Past Medical History:  Diagnosis Date   Chest pain    cath 12/31/10: post AV CFX < 10% (no significant CAD), EF 60%    Family history of ischemic heart disease    GERD (gastroesophageal reflux disease)    intermittent   HLD (hyperlipidemia)    Hx of cardiovascular stress test 11/2016   normal exercise stress test   Hx of echocardiogram Aug 2012   Normal   Lipoma    Dr. Greer Pickerel, consult 2013, pending surgery   Obesity    Peyronie disease    Urology consult prior    Past Surgical History:  Procedure Laterality Date   CARDIAC CATHETERIZATION  12/2010   Dr. Marigene Ehlers    COLONOSCOPY  04/2017   normal, Dr. Zenovia Jarred   LIPOMA EXCISION N/A 01/03/2016   Procedure: EXCISION LOWER BACK LIPOMA;  Surgeon: Greer Pickerel, MD;  Location: [place];  Service: General;  Laterality: N/A;   stye surgery      Family History  Problem Relation Age of Onset   Asthma Mother    Mental illness Mother    Heart disease Mother 43       died of CHF   Arthritis Father    Diabetes Father    Heart disease Father 67       heart transplant, died of heart disease 5 years later   Hypertension Brother    Arthritis Paternal Aunt    Diabetes  Paternal Aunt    Arthritis Paternal Uncle    Diabetes Paternal Uncle    Clotting disorder Maternal Aunt    Colon cancer Maternal Aunt    Cancer Maternal Grandmother    Stroke Neg Hx      Current Outpatient Medications:    amoxicillin (AMOXIL) 875 MG tablet, Take 1 tablet (875 mg total) by mouth 2 (two) times daily for 10 days., Disp: 20 tablet, Rfl: 0   lovastatin (MEVACOR) 40 MG tablet, Take 1 tablet (40 mg total) by mouth at bedtime., Disp: 90 tablet, Rfl: 3  Allergies  Allergen Reactions   Iodinated Contrast Media Hives and Rash    Review of Systems  Constitutional:  Positive for malaise/fatigue. Negative for chills, fever and weight loss.  HENT:  Negative for congestion, ear pain, hearing loss, sore throat and tinnitus.   Eyes:  Negative for blurred vision, pain and redness.  Respiratory:  Negative for cough, hemoptysis and shortness of breath.   Cardiovascular:  Negative for chest pain, palpitations, orthopnea, claudication and leg swelling.  Gastrointestinal:  Negative for abdominal pain, blood in stool, constipation, diarrhea, nausea and vomiting.  Genitourinary:  Negative for dysuria, flank pain, frequency, hematuria and urgency.  Musculoskeletal:  Negative for falls, joint pain and myalgias.  Skin:  Negative for  itching and rash.  Neurological:  Negative for dizziness, tingling, speech change, weakness and headaches.  Endo/Heme/Allergies:  Negative for polydipsia. Does not bruise/bleed easily.  Psychiatric/Behavioral:  Negative for depression and memory loss. The patient is not nervous/anxious and does not have insomnia.         Objective:   BP 124/76   Pulse 76   Ht '6\' 1"'$  (1.854 m)   Wt 230 lb (104.3 kg)   BMI 30.34 kg/m   Wt Readings from Last 3 Encounters:  06/11/22 230 lb (104.3 kg)  09/09/21 232 lb 9.6 oz (105.5 kg)  06/10/21 229 lb 3.2 oz (104 kg)   General appearance: alert, no distress, WD/WN, African American male Skin: tattoo brand scar on left  upper arm, chinese characters/words tattoo on right upper arm, few scattered benign lesions HEENT: normocephalic, conjunctiva/corneas normal, sclerae anicteric, PERRLA, EOMi, nares patent, no discharge or erythema, pharynx normal Oral cavity: MMM, tongue normal Neck: supple, no lymphadenopathy, no thyromegaly, no masses, normal ROM, no bruits Chest: non tender, normal shape and expansion Heart: RRR, normal S1, S2, no murmurs Lungs: CTA bilaterally, no wheezes, rhonchi, or rales Abdomen: +bs, soft, non tender, non distended, no masses, no hepatomegaly, no splenomegaly, no bruits Back: lower back surgical scar, otherwise non tender, normal ROM, no scoliosis Musculoskeletal: upper extremities non tender, no obvious deformity, normal ROM throughout, lower extremities non tender, no obvious deformity, normal ROM throughout Extremities: no edema, no cyanosis, no clubbing Pulses: 2+ symmetric, upper and lower extremities, normal cap refill Neurological: alert, oriented x 3, CN2-12 intact, strength normal upper extremities and lower extremities, sensation normal throughout, DTRs 2+ throughout, no cerebellar signs, gait normal Psychiatric: normal affect, behavior normal, pleasant  GU: normal male external genitalia,circumcised, nontender, no masses, no hernia, no lymphadenopathy Rectal: anus normal tone, prostate mildly enlarged, no nodules   Assessment and Plan :    Encounter Diagnoses  Name Primary?   Annual physical exam Yes   Need for influenza vaccination    History of COVID-19    Hyperlipidemia, unspecified hyperlipidemia type    Other fatigue    Screening for prostate cancer    Sinus pressure    Vitamin deficiency    Acute nonintractable headache, unspecified headache type     Health screening: See your eye doctor yearly for routine vision care. See your dentist yearly for routine dental care including hygiene visits twice yearly.  I reviewed his 11/2016 cardiac stress test that was  normal I reviewed his 04/2017 colonoscopy that was normal I reviewed 2021 CT coronary score of zero.  Updated PSA today, discussed risks/benefits of testing  Vaccinations: Counseled on the following vaccines:  influenza Shingles tetanus  Counseled on the influenza virus vaccine.  Vaccine information sheet given.  Influenza vaccine given after consent obtained.  He wants to return after March for Shingrix   Other significant issues discussed: Recent covid, headaches, sinus pressure - advised OTC decongestant.  If not improving in the next 4-5 days, begin antibiotic below    Obesity - continue efforts to lose weight through health diet and exercise  hyperlipidemia - compliant with statin, labs today fasting  Vitamin D deficiency - updated lab today  Xaine was seen today for annual exam.  Diagnoses and all orders for this visit:  Annual physical exam -     POCT Urinalysis DIP (Proadvantage Device) -     Comprehensive metabolic panel -     CBC with Differential/Platelet -     Lipid panel -  VITAMIN D 25 Hydroxy (Vit-D Deficiency, Fractures) -     PSA -     POCT Urinalysis DIP (Proadvantage Device) -     Testosterone  Need for influenza vaccination -     Flu Vaccine QUAD 6+ mos PF IM (Fluarix Quad PF)  History of COVID-19  Hyperlipidemia, unspecified hyperlipidemia type -     Lipid panel  Other fatigue -     Testosterone  Screening for prostate cancer -     PSA  Sinus pressure  Vitamin deficiency -     VITAMIN D 25 Hydroxy (Vit-D Deficiency, Fractures)  Acute nonintractable headache, unspecified headache type  Other orders -     amoxicillin (AMOXIL) 875 MG tablet; Take 1 tablet (875 mg total) by mouth 2 (two) times daily for 10 days.   Follow-up pending labs, yearly for physical

## 2022-06-12 ENCOUNTER — Other Ambulatory Visit: Payer: Self-pay | Admitting: Medical

## 2022-06-12 LAB — LIPID PANEL
Chol/HDL Ratio: 3.3 ratio (ref 0.0–5.0)
Cholesterol, Total: 203 mg/dL — ABNORMAL HIGH (ref 100–199)
HDL: 61 mg/dL (ref >39)
LDL Chol Calc (NIH): 130 mg/dL — ABNORMAL HIGH (ref 0–99)
Triglycerides: 69 mg/dL (ref 0–149)
VLDL Cholesterol Cal: 12 mg/dL (ref 5–40)

## 2022-06-12 LAB — CBC WITH DIFFERENTIAL/PLATELET
Basophils Absolute: 0.1 10*3/uL (ref 0.0–0.2)
Basos: 1 %
EOS (ABSOLUTE): 0.1 10*3/uL (ref 0.0–0.4)
Eos: 3 %
Hematocrit: 44.9 % (ref 37.5–51.0)
Hemoglobin: 15.6 g/dL (ref 13.0–17.7)
Immature Grans (Abs): 0 10*3/uL (ref 0.0–0.1)
Immature Granulocytes: 0 %
Lymphocytes Absolute: 1.7 10*3/uL (ref 0.7–3.1)
Lymphs: 31 %
MCH: 30.4 pg (ref 26.6–33.0)
MCHC: 34.7 g/dL (ref 31.5–35.7)
MCV: 88 fL (ref 79–97)
Monocytes Absolute: 0.6 10*3/uL (ref 0.1–0.9)
Monocytes: 11 %
Neutrophils Absolute: 3.1 10*3/uL (ref 1.4–7.0)
Neutrophils: 54 %
Platelets: 367 10*3/uL (ref 150–450)
RBC: 5.13 x10E6/uL (ref 4.14–5.80)
RDW: 13.2 % (ref 11.6–15.4)
WBC: 5.7 10*3/uL (ref 3.4–10.8)

## 2022-06-12 LAB — VITAMIN D 25 HYDROXY (VIT D DEFICIENCY, FRACTURES): Vit D, 25-Hydroxy: 13.8 ng/mL — ABNORMAL LOW (ref 30.0–100.0)

## 2022-06-12 LAB — COMPREHENSIVE METABOLIC PANEL
ALT: 26 IU/L (ref 0–44)
AST: 24 IU/L (ref 0–40)
Albumin/Globulin Ratio: 1.8 (ref 1.2–2.2)
Albumin: 4.4 g/dL (ref 3.8–4.9)
Alkaline Phosphatase: 108 IU/L (ref 44–121)
BUN/Creatinine Ratio: 17 (ref 9–20)
BUN: 16 mg/dL (ref 6–24)
Bilirubin Total: 0.7 mg/dL (ref 0.0–1.2)
CO2: 24 mmol/L (ref 20–29)
Calcium: 9.2 mg/dL (ref 8.7–10.2)
Chloride: 101 mmol/L (ref 96–106)
Creatinine, Ser: 0.96 mg/dL (ref 0.76–1.27)
Globulin, Total: 2.4 g/dL (ref 1.5–4.5)
Glucose: 91 mg/dL (ref 70–99)
Potassium: 4.4 mmol/L (ref 3.5–5.2)
Sodium: 138 mmol/L (ref 134–144)
Total Protein: 6.8 g/dL (ref 6.0–8.5)
eGFR: 93 mL/min/{1.73_m2} (ref >59)

## 2022-06-12 LAB — TESTOSTERONE: Testosterone: 417 ng/dL (ref 264–916)

## 2022-06-12 LAB — PSA: Prostate Specific Ag, Serum: 1.2 ng/mL (ref 0.0–4.0)

## 2022-06-12 MED ORDER — VITAMIN D 50 MCG (2000 UT) PO CAPS: 1.0000 | ORAL_CAPSULE | Freq: Every day | ORAL | 3 refills | Status: DC

## 2022-06-12 NOTE — Progress Notes (Signed)
Results sent through MyChart

## 2022-06-16 ENCOUNTER — Other Ambulatory Visit: Payer: Self-pay | Admitting: Medical

## 2022-06-16 MED ORDER — ROSUVASTATIN CALCIUM 10 MG PO TABS: 10.0000 mg | ORAL_TABLET | Freq: Every day | ORAL | 3 refills | Status: DC

## 2022-08-06 ENCOUNTER — Encounter: Payer: Self-pay | Admitting: Family Medicine

## 2022-08-06 ENCOUNTER — Ambulatory Visit: Payer: BC Managed Care – PPO | Admitting: Family Medicine

## 2022-08-06 VITALS — BP 110/80 | HR 60 | Temp 96.4°F | Ht 73.0 in | Wt 231.0 lb

## 2022-08-06 DIAGNOSIS — J40 Bronchitis, not specified as acute or chronic: Secondary | ICD-10-CM | POA: Diagnosis not present

## 2022-08-06 MED ORDER — AZITHROMYCIN 500 MG PO TABS: 500.0000 mg | ORAL_TABLET | Freq: Every day | ORAL | 0 refills | Status: DC

## 2022-08-06 NOTE — Progress Notes (Signed)
   Subjective:    Patient ID: Aaron Grim., male    DOB: 08/21/1965, 57 y.o.   MRN: [id number]  HPI He complains of a month-long history of difficulty with slight left-sided sore throat, intermittently productive cough, hoarse voice but no fever, chills, earache.   Review of Systems     Objective:   Physical Exam Alert and in no distress. Tympanic membranes and canals are normal. Pharyngeal area is normal. Neck is supple without adenopathy or thyromegaly. Cardiac exam shows a regular sinus rhythm without murmurs or gallops. Lungs are clear to auscultation.        Assessment & Plan:  Bronchitis - Plan: azithromycin (ZITHROMAX) 500 MG tablet Explained that he needs to give the medicine 7 to 10 days and if still having difficulty, call me for possible refill.

## 2022-09-21 ENCOUNTER — Other Ambulatory Visit: Payer: BC Managed Care – PPO

## 2022-09-22 ENCOUNTER — Ambulatory Visit: Payer: BC Managed Care – PPO | Admitting: Medical

## 2022-09-22 ENCOUNTER — Encounter: Payer: Self-pay | Admitting: Medical

## 2022-09-22 ENCOUNTER — Other Ambulatory Visit: Payer: BC Managed Care – PPO

## 2022-09-22 VITALS — BP 120/68 | HR 72 | Ht 71.0 in | Wt 233.4 lb

## 2022-09-22 DIAGNOSIS — E785 Hyperlipidemia, unspecified: Secondary | ICD-10-CM

## 2022-09-22 DIAGNOSIS — G8929 Other chronic pain: Secondary | ICD-10-CM

## 2022-09-22 DIAGNOSIS — Z79899 Other long term (current) drug therapy: Secondary | ICD-10-CM | POA: Diagnosis not present

## 2022-09-22 DIAGNOSIS — M545 Low back pain, unspecified: Secondary | ICD-10-CM

## 2022-09-22 DIAGNOSIS — Z7185 Encounter for immunization safety counseling: Secondary | ICD-10-CM | POA: Diagnosis not present

## 2022-09-22 DIAGNOSIS — Z23 Encounter for immunization: Secondary | ICD-10-CM | POA: Diagnosis not present

## 2022-09-22 NOTE — Progress Notes (Signed)
Subjective:  Koua Deeg. is a 57 y.o. male who presents for Chief Complaint  Patient presents with   Follow-up    Fasting 3 month follow up on chol, changed statin in Jan. From lovastatin to rosuvastatin. And needs Shingrix. Has had some side effects, joint aches. Lower left back pain.      Here for f/u on lipids and vitamin D.  In January we added vitamin D supplement and changed from Lovastatin to Rosuvastatin.   He is compliant with Crestor without c/o.    Had started vitamin D supplement and started having worse back pain.  Was having some back pains already but vitamin seemed to worsen the pain so he cut back on vitamin D supplement and the back pain lessened.    He has a history of chronic back pain.  He thinks one of the medications aggravated his back pain.  He initially thought it was the vitamin D so that is why he discontinued this.  He denies any leg pain or radicular leg pain numbness or tingling or weakness.  Just low back pain on the left and right.  No urinary issues, no incontinence, no fever, no weight loss.  No other aggravating or relieving factors.    No other c/o.  The following portions of the patient's history were reviewed and updated as appropriate: allergies, current medications, past family history, past medical history, past social history, past surgical history and problem list.  ROS Otherwise as in subjective above  Objective: BP 120/68   Pulse 72   Ht  (1.803 m)   Wt 233 lb 6.4 oz (105.9 kg)   BMI 32.55 kg/m   Wt Readings from Last 3 Encounters:  09/22/22 233 lb 6.4 oz (105.9 kg)  08/06/22 231 lb (104.8 kg)  06/11/22 230 lb (104.3 kg)    General appearance: alert, no distress, well developed, well nourished Abdomen: +bs, soft, non tender, non distended, no masses, no hepatomegaly, no splenomegaly Mild lumbar tenderness paraspinal, otherwise nontender, full range of motion, no midline tenderness Legs nontender with normal range of  motion without deformity Pulses: 2+ radial pulses, 2+ pedal pulses, normal cap refill Ext: no edema   Assessment: Encounter Diagnoses  Name Primary?   Hyperlipidemia, unspecified hyperlipidemia type Yes   High risk medication use    Chronic bilateral low back pain, unspecified whether sciatica present    Vaccine counseling    Need for shingles vaccine      Plan: Hyperlipidemia-compliant with Crestor.  We changed her Crestor from lovastatin back in January.  Updated labs today.  Given his recent myalgias and back pain I am going to have him stop cholesterol medicine for 2 weeks to see if the pains resolve in case the statins are causing a problem  Vitamin D deficiency-currently not on vitamin D as he felt like was causing back pain.  Consider over-the-counter supplement instead.  Chronic low back pain-we discussed his previous x-rays in the chart record.  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  Counseled on some routine exercises and resistance training he can do to keep the back strong.  Consider chiropractor or physical therapy consult.  If he sees improvements by stopping the statin then the statin could be also aggravating his back pain but we will give this test over the next 2 weeks and see you.  No worse symptoms suggesting that we need to get an MRI at this time.  Counseled on the  Shingrix vaccine.  Vaccine information sheet given. Shingrix #1 vaccine given after consent obtained.   Return in 2 months for Shingrix #2.  Return in a few weeks for Tdap booster as well   Iyan was seen today for follow-up.  Diagnoses and all orders for this visit:  Hyperlipidemia, unspecified hyperlipidemia type -     Lipid panel -     ALT -     CK  High risk medication use -     Lipid panel -     ALT -     CK  Chronic bilateral low back pain, unspecified whether sciatica present  Vaccine counseling  Need for shingles vaccine  Other orders -     Zoster  Recombinant (Shingrix )    Follow up: Pending labs

## 2022-09-23 LAB — LIPID PANEL
Chol/HDL Ratio: 3.1 ratio (ref 0.0–5.0)
Cholesterol, Total: 164 mg/dL (ref 100–199)
HDL: 53 mg/dL (ref >39)
LDL Chol Calc (NIH): 94 mg/dL (ref 0–99)
Triglycerides: 89 mg/dL (ref 0–149)
VLDL Cholesterol Cal: 17 mg/dL (ref 5–40)

## 2022-09-23 LAB — CK: Total CK: 185 U/L (ref 41–331)

## 2022-09-23 LAB — ALT: ALT: 25 IU/L (ref 0–44)

## 2022-09-23 NOTE — Progress Notes (Signed)
Results sent through MyChart

## 2022-11-30 ENCOUNTER — Other Ambulatory Visit: Payer: BC Managed Care – PPO

## 2023-01-06 ENCOUNTER — Ambulatory Visit: Payer: BC Managed Care – PPO | Admitting: Medical

## 2023-01-06 VITALS — BP 120/80 | HR 63 | Temp 97.1°F | Wt 232.2 lb

## 2023-01-06 DIAGNOSIS — H938X2 Other specified disorders of left ear: Secondary | ICD-10-CM

## 2023-01-06 DIAGNOSIS — H68002 Unspecified Eustachian salpingitis, left ear: Secondary | ICD-10-CM

## 2023-01-06 MED ORDER — PSEUDOEPHEDRINE HCL 30 MG PO TABS: 30.0000 mg | ORAL_TABLET | Freq: Three times a day (TID) | ORAL | 0 refills | Status: DC | PRN

## 2023-01-06 MED ORDER — AMOXICILLIN 875 MG PO TABS: 875.0000 mg | ORAL_TABLET | Freq: Two times a day (BID) | ORAL | 0 refills | Status: AC

## 2023-01-06 MED ORDER — FLUTICASONE PROPIONATE 50 MCG/ACT NA SUSP: 2.0000 | Freq: Every day | NASAL | 0 refills | Status: DC

## 2023-01-06 NOTE — Progress Notes (Signed)
Subjective:  Aaron Patel. is a 57 y.o. male who presents for Chief Complaint  Patient presents with   left ear clogged    Left ear clogged on and off since July 18th when he flew home- pressure. Covid positive July 16th.      Here for clogged ear.   Just had covid starting around 12/22/22.   Has had some recent sinus pressure, sinus drainage.  Feels clogged up at times in nose and or ear.  Feels ear clogged feeling x 4-5 days.   No significant cough, runny nose, sore throat in last few days.  No fever, body ache or chills in last few days.    Feels resolved from covid in general.   Using mucous relief and nasal saline.    No other aggravating or relieving factors.    No other c/o.  The following portions of the patient's history were reviewed and updated as appropriate: allergies, current medications, past family history, past medical history, past social history, past surgical history and problem list.  ROS Otherwise as in subjective above    Objective: BP 120/80   Pulse 63   Temp (!) 97.1 F (36.2 C)   Wt 232 lb 3.2 oz (105.3 kg)   BMI 32.39 kg/m   General appearance: alert, no distress, well developed, well nourished HEENT: normocephalic, sclerae anicteric, conjunctiva pink and moist, TMs pearly, nares patent, no discharge or erythema, pharynx normal Oral cavity: MMM, no lesions Neck: supple, no lymphadenopathy, no thyromegaly, no masses Lungs: CTA bilaterally, no wheezes, rhonchi, or rales   Assessment: Encounter Diagnoses  Name Primary?   Pressure sensation in left ear Yes   Salpingitis of left eustachian tube      Plan: We discussed exam findings and symptoms.  He recently had COVID and then flew on the airplane with pressure changes.  The pressure changes seem to have been bothering his eustachian tubes.  Reassured no obvious infection today.  Begin the recommendations below.   Patient Instructions  Recommendations: Continue good water intake such  as 80-100 ounces daily Begin nasal spray such as Flonase, Nasacort or even Afrin.  Flonase is a nasal steroid to reduce inflammation and swelling in the nostril.   Your nostrils are swollen today Flonase can be used daily for a few week if needed, whereas Afrin is a fastin acting but short term, 3 days or less, topical decongestant Begin trial of Sudafed twice daily by mouth for decongestant You can continue nasal saline flush several more days If no improvement at all or worse by the weekend, consider antibiotic only if the above is not helping    Tonya was seen today for left ear clogged.  Diagnoses and all orders for this visit:  Pressure sensation in left ear  Salpingitis of left eustachian tube  Other orders -     pseudoephedrine (SUDAFED) 30 MG tablet; Take 1 tablet (30 mg total) by mouth every 8 (eight) hours as needed for congestion. -     amoxicillin (AMOXIL) 875 MG tablet; Take 1 tablet (875 mg total) by mouth 2 (two) times daily for 10 days. -     fluticasone (FLONASE) 50 MCG/ACT nasal spray; Place 2 sprays into both nostrils daily.    Follow up: prn

## 2023-01-06 NOTE — Patient Instructions (Signed)
Recommendations: Continue good water intake such as 80-100 ounces daily Begin nasal spray such as Flonase, Nasacort or even Afrin.  Flonase is a nasal steroid to reduce inflammation and swelling in the nostril.   Your nostrils are swollen today Flonase can be used daily for a few week if needed, whereas Afrin is a fastin acting but short term, 3 days or less, topical decongestant Begin trial of Sudafed twice daily by mouth for decongestant You can continue nasal saline flush several more days If no improvement at all or worse by the weekend, consider antibiotic only if the above is not helping

## 2023-06-14 ENCOUNTER — Encounter: Payer: BC Managed Care – PPO | Admitting: Medical

## 2023-06-28 ENCOUNTER — Ambulatory Visit: Payer: 59 | Admitting: Medical

## 2023-06-28 VITALS — BP 120/70 | HR 74 | Ht 72.0 in | Wt 231.2 lb

## 2023-06-28 DIAGNOSIS — Z0000 Encounter for general adult medical examination without abnormal findings: Secondary | ICD-10-CM

## 2023-06-28 DIAGNOSIS — Z1211 Encounter for screening for malignant neoplasm of colon: Secondary | ICD-10-CM

## 2023-06-28 DIAGNOSIS — E785 Hyperlipidemia, unspecified: Secondary | ICD-10-CM

## 2023-06-28 DIAGNOSIS — E559 Vitamin D deficiency, unspecified: Secondary | ICD-10-CM | POA: Diagnosis not present

## 2023-06-28 DIAGNOSIS — M545 Low back pain, unspecified: Secondary | ICD-10-CM

## 2023-06-28 DIAGNOSIS — Z125 Encounter for screening for malignant neoplasm of prostate: Secondary | ICD-10-CM

## 2023-06-28 DIAGNOSIS — Z23 Encounter for immunization: Secondary | ICD-10-CM | POA: Diagnosis not present

## 2023-06-28 DIAGNOSIS — G8929 Other chronic pain: Secondary | ICD-10-CM

## 2023-06-28 LAB — LIPID PANEL

## 2023-06-28 NOTE — Assessment & Plan Note (Signed)
Not currently taking supplement.  Updated labs today

## 2023-06-28 NOTE — Progress Notes (Signed)
Complete physical exam  Patient: Aaron Patel.   DOB: Oct 26, 1965   57 y.o. Male  MRN: [id number]  Subjective:    Chief Complaint  Patient presents with   Annual Exam    Fasting cpe, back pain x 4 months. Needs tdap today. Will come back for Shingles, flu and covid another time    Aaron Patel. is a 58 y.o. male who presents today for a complete physical exam. He reports consuming a  relatively healthy  diet.  He generally feels fairly well. He reports sleeping fairly well. He does have additional problems to discuss today.   Having some back pains  Hyperlipidemia - compliant with Crestor 10mg  daily  Not current taking his vitamin D supplement   Most recent fall risk assessment:    06/28/2023    8:15 AM  Fall Risk   Falls in the past year? 0  Number falls in past yr: 0  Injury with Fall? 0  Risk for fall due to : No Fall Risks  Follow up Falls evaluation completed     Most recent depression screenings:    06/28/2023    8:15 AM 06/11/2022    8:30 AM  PHQ 2/9 Scores  PHQ - 2 Score 0 0    Vision:Within last year and Dental: No current dental problems  Patient Active Problem List   Diagnosis Date Noted   Vitamin D deficiency 06/28/2023   Annual physical exam 06/11/2022   Other fatigue 09/09/2021   History of COVID-19 09/09/2021   Sinus pressure 09/09/2021   Constipation 06/10/2021   Abdominal bloating 06/10/2021   Encounter for health maintenance examination in adult 03/03/2019   Polyarthralgia 03/03/2019   Chronic bilateral low back pain 08/24/2018   Screening for prostate cancer 01/07/2017   Family history of heart disease in male family member before age 28 12/24/2015   Gastroesophageal reflux disease without esophagitis 12/24/2015   Lipoma of back 11/25/2011   Peyronie's disease 05/04/2011   HLD (hyperlipidemia)    Past Medical History:  Diagnosis Date   Chest pain    cath 12/31/10: post AV CFX < 10% (no significant CAD), EF 60%    Family  history of ischemic heart disease    GERD (gastroesophageal reflux disease)    intermittent   HLD (hyperlipidemia)    Hx of cardiovascular stress test 11/2016   normal exercise stress test   Hx of echocardiogram Aug 2012   Normal   Lipoma    Dr. Gaynelle Adu, consult 2013, pending surgery   Obesity    Peyronie disease    Urology consult prior   Past Surgical History:  Procedure Laterality Date   CARDIAC CATHETERIZATION  12/2010   Dr. Jearld Pies    COLONOSCOPY  04/2017   normal, Dr. Erick Blinks   LIPOMA EXCISION N/A 01/03/2016   Procedure: EXCISION LOWER BACK LIPOMA;  Surgeon: Gaynelle Adu, MD;  Location: [facility] OR;  Service: General;  Laterality: N/A;   stye surgery     Social History   Tobacco Use   Smoking status: Former    Current packs/day: 0.00    Types: Cigarettes    Quit date: 12/07/2005    Years since quitting: 17.5   Smokeless tobacco: Never  Vaping Use   Vaping status: Never Used  Substance Use Topics   Alcohol use: Not Currently    Comment: occasionally   Drug use: No   Family History  Problem Relation Age of Onset   Asthma Mother  Mental illness Mother    Heart disease Mother 76       died of CHF   Arthritis Father    Diabetes Father    Heart disease Father 57       heart transplant, died of heart disease 5 years later   Hypertension Brother    Arthritis Paternal Aunt    Diabetes Paternal Aunt    Arthritis Paternal Uncle    Diabetes Paternal Uncle    Clotting disorder Maternal Aunt    Colon cancer Maternal Aunt    Cancer Maternal Grandmother    Stroke Neg Hx    Allergies  Allergen Reactions   Iodinated Contrast Media Hives and Rash      Patient Care Team: Annalis Kaczmarczyk, Kermit Balo, PA-C as PCP - General (Family Medicine)   Outpatient Medications Prior to Visit  Medication Sig   fluticasone (FLONASE) 50 MCG/ACT nasal spray Place 2 sprays into both nostrils daily.   rosuvastatin (CRESTOR) 10 MG tablet Take 1 tablet (10 mg total) by mouth daily.    Cholecalciferol (VITAMIN D) 50 MCG (2000 UT) CAPS Take 1 capsule (2,000 Units total) by mouth daily. (Patient not taking: Reported on 06/28/2023)   [DISCONTINUED] pseudoephedrine (SUDAFED) 30 MG tablet Take 1 tablet (30 mg total) by mouth every 8 (eight) hours as needed for congestion. (Patient not taking: Reported on 06/28/2023)   No facility-administered medications prior to visit.    Review of Systems  Constitutional:  Negative for chills, fever, malaise/fatigue and weight loss.  HENT:  Negative for congestion, ear pain, hearing loss, sore throat and tinnitus.   Eyes:  Negative for blurred vision, pain and redness.  Respiratory:  Negative for cough, hemoptysis and shortness of breath.   Cardiovascular:  Negative for chest pain, palpitations, orthopnea, claudication and leg swelling.  Gastrointestinal:  Negative for abdominal pain, blood in stool, constipation, diarrhea, nausea and vomiting.  Genitourinary:  Negative for dysuria, flank pain, frequency, hematuria and urgency.  Musculoskeletal:  Positive for back pain. Negative for falls, joint pain and myalgias.  Skin:  Negative for itching and rash.  Neurological:  Negative for dizziness, tingling, speech change, weakness and headaches.  Endo/Heme/Allergies:  Negative for polydipsia. Does not bruise/bleed easily.  Psychiatric/Behavioral:  Negative for depression and memory loss. The patient is not nervous/anxious and does not have insomnia.         Objective:     BP 120/70   Pulse 74   Ht 6' (1.829 m)   Wt 231 lb 3.2 oz (104.9 kg)   SpO2 98%   BMI 31.36 kg/m  BP Readings from Last 3 Encounters:  06/28/23 120/70  01/06/23 120/80  09/22/22 120/68   Wt Readings from Last 3 Encounters:  06/28/23 231 lb 3.2 oz (104.9 kg)  01/06/23 232 lb 3.2 oz (105.3 kg)  09/22/22 233 lb 6.4 oz (105.9 kg)      Physical Exam Vitals and nursing note reviewed.  Constitutional:      General: He is not in acute distress.    Appearance: Normal  appearance. He is not ill-appearing.  HENT:     Head: Normocephalic and atraumatic.     Right Ear: External ear normal.     Left Ear: External ear normal.     Nose: Nose normal.     Mouth/Throat:     Mouth: Mucous membranes are moist.     Pharynx: Oropharynx is clear.  Eyes:     Extraocular Movements: Extraocular movements intact.     Conjunctiva/sclera:  Conjunctivae normal.     Pupils: Pupils are equal, round, and reactive to light.  Neck:     Vascular: No carotid bruit.  Cardiovascular:     Rate and Rhythm: Normal rate and regular rhythm.     Pulses: Normal pulses.     Heart sounds: Normal heart sounds.  Pulmonary:     Effort: Pulmonary effort is normal.     Breath sounds: Normal breath sounds.  Abdominal:     General: Bowel sounds are normal. There is no distension.     Palpations: Abdomen is soft. There is no mass.     Tenderness: There is no abdominal tenderness.     Hernia: No hernia is present.  Genitourinary:    Penis: Normal.      Testes: Normal.     Prostate: Normal.  Musculoskeletal:        General: No swelling, tenderness or deformity.     Cervical back: Normal range of motion and neck supple. No tenderness.     Right lower leg: No edema.     Left lower leg: No edema.     Comments: Bilat leg extension slightly reduced, somewhat tight hamstrings bilat  Lymphadenopathy:     Cervical: No cervical adenopathy.  Skin:    General: Skin is warm and dry.     Capillary Refill: Capillary refill takes less than 2 seconds.  Neurological:     General: No focal deficit present.     Mental Status: He is alert and oriented to person, place, and time. Mental status is at baseline.     Cranial Nerves: No cranial nerve deficit.     Sensory: No sensory deficit.     Motor: No weakness.     Gait: Gait normal.     Deep Tendon Reflexes: Reflexes normal.  Psychiatric:        Mood and Affect: Mood normal.        Behavior: Behavior normal.        Judgment: Judgment normal.          Assessment & Plan:    Routine Health Maintenance and Physical Exam  Immunization History  Administered Date(s) Administered   Influenza Whole 03/10/2010   Influenza,inj,Quad PF,6+ Mos 02/22/2014, 04/29/2020, 06/11/2022   PFIZER(Purple Top)SARS-COV-2 Vaccination 08/11/2019, 09/01/2019, 04/29/2020, 12/18/2020   PPD Test 01/11/2002   Pfizer Covid-19 Vaccine Bivalent Booster 16yrs & up 06/10/2021   Td 12/06/2000   Tdap 09/07/2011, 06/28/2023   Zoster Recombinant(Shingrix) 09/22/2022    Health Maintenance  Topic Date Due   COVID-19 Vaccine (6 - 2024-25 season) 07/14/2023 (Originally 02/07/2023)   INFLUENZA VACCINE  09/06/2023 (Originally 01/07/2023)   Zoster Vaccines- Shingrix (2 of 2) 09/26/2023 (Originally 11/17/2022)   Colonoscopy  04/09/2027   DTaP/Tdap/Td (4 - Td or Tdap) 06/27/2033   Hepatitis C Screening  Completed   HIV Screening  Completed   HPV VACCINES  Aged Out    Discussed health benefits of physical activity, and encouraged him to engage in regular exercise appropriate for his age and condition.  Problem List Items Addressed This Visit     HLD (hyperlipidemia)   Continue Crestor 10mg  daily.  Updated labs today      Relevant Orders   Lipid panel   Screening for prostate cancer   Relevant Orders   PSA   Chronic bilateral low back pain   Referral to PT at his request.  Reviewed prior imaging in the chart record from few years ago      Relevant  Orders   Ambulatory referral to Physical Therapy   Encounter for health maintenance examination in adult - Primary   Relevant Orders   Comprehensive metabolic panel   CBC   Lipid panel   PSA   Vitamin D deficiency   Not currently taking supplement.  Updated labs today      Relevant Orders   VITAMIN D 25 Hydroxy (Vit-D Deficiency, Fractures)   Other Visit Diagnoses       Screening for colon cancer       Relevant Orders   Fecal occult blood, imunochemical     Routine general medical examination at a health  care facility         Colon cancer screening         Vaccine for diphtheria-tetanus-pertussis, combined       Relevant Orders   Tdap vaccine greater than or equal to 7yo IM (Completed)      Return in 1 year (on 06/27/2024).     Kristian Covey, PA-C

## 2023-06-28 NOTE — Assessment & Plan Note (Signed)
Continue Crestor 10mg  daily.  Updated labs today

## 2023-06-28 NOTE — Assessment & Plan Note (Signed)
Referral to PT at his request.  Reviewed prior imaging in the chart record from few years ago

## 2023-06-28 NOTE — Patient Instructions (Signed)
Health Maintenance, Male Adopting a healthy lifestyle and getting preventive care are important in promoting health and wellness. Ask your health care provider about: The right schedule for you to have regular tests and exams. Things you can do on your own to prevent diseases and keep yourself healthy. What should I know about diet, weight, and exercise? Eat a healthy diet  Eat a diet that includes plenty of vegetables, fruits, low-fat dairy products, and lean protein. Do not eat a lot of foods that are high in solid fats, added sugars, or sodium. Maintain a healthy weight Body mass index (BMI) is a measurement that can be used to identify possible weight problems. It estimates body fat based on height and weight. Your health care provider can help determine your BMI and help you achieve or maintain a healthy weight. Get regular exercise Get regular exercise. This is one of the most important things you can do for your health. Most adults should: Exercise for at least 150 minutes each week. The exercise should increase your heart rate and make you sweat (moderate-intensity exercise). Do strengthening exercises at least twice a week. This is in addition to the moderate-intensity exercise. Spend less time sitting. Even light physical activity can be beneficial. Watch cholesterol and blood lipids Have your blood tested for lipids and cholesterol at 57 years of age, then have this test every 5 years. You may need to have your cholesterol levels checked more often if: Your lipid or cholesterol levels are high. You are older than 57 years of age. You are at high risk for heart disease. What should I know about cancer screening? Many types of cancers can be detected early and may often be prevented. Depending on your health history and family history, you may need to have cancer screening at various ages. This may include screening for: Colorectal cancer. Prostate cancer. Skin cancer. Lung  cancer. What should I know about heart disease, diabetes, and high blood pressure? Blood pressure and heart disease High blood pressure causes heart disease and increases the risk of stroke. This is more likely to develop in people who have high blood pressure readings or are overweight. Talk with your health care provider about your target blood pressure readings. Have your blood pressure checked: Every 3-5 years if you are 18-39 years of age. Every year if you are 40 years old or older. If you are between the ages of 65 and 75 and are a current or former smoker, ask your health care provider if you should have a one-time screening for abdominal aortic aneurysm (AAA). Diabetes Have regular diabetes screenings. This checks your fasting blood sugar level. Have the screening done: Once every three years after age 45 if you are at a normal weight and have a low risk for diabetes. More often and at a younger age if you are overweight or have a high risk for diabetes. What should I know about preventing infection? Hepatitis B If you have a higher risk for hepatitis B, you should be screened for this virus. Talk with your health care provider to find out if you are at risk for hepatitis B infection. Hepatitis C Blood testing is recommended for: Everyone born from 1945 through 1965. Anyone with known risk factors for hepatitis C. Sexually transmitted infections (STIs) You should be screened each year for STIs, including gonorrhea and chlamydia, if: You are sexually active and are younger than 57 years of age. You are older than 57 years of age and your   health care provider tells you that you are at risk for this type of infection. Your sexual activity has changed since you were last screened, and you are at increased risk for chlamydia or gonorrhea. Ask your health care provider if you are at risk. Ask your health care provider about whether you are at high risk for HIV. Your health care provider  may recommend a prescription medicine to help prevent HIV infection. If you choose to take medicine to prevent HIV, you should first get tested for HIV. You should then be tested every 3 months for as long as you are taking the medicine. Follow these instructions at home: Alcohol use Do not drink alcohol if your health care provider tells you not to drink. If you drink alcohol: Limit how much you have to 0-2 drinks a day. Know how much alcohol is in your drink. In the [place] one drink equals one 12 oz bottle of beer (355 mL), one 5 oz glass of wine (148 mL), or one 1 oz glass of hard liquor (44 mL). Lifestyle Do not use any products that contain nicotine or tobacco. These products include cigarettes, chewing tobacco, and vaping devices, such as e-cigarettes. If you need help quitting, ask your health care provider. Do not use street drugs. Do not share needles. Ask your health care provider for help if you need support or information about quitting drugs. General instructions Schedule regular health, dental, and eye exams. Stay current with your vaccines. Tell your health care provider if: You often feel depressed. You have ever been abused or do not feel safe at home. Summary Adopting a healthy lifestyle and getting preventive care are important in promoting health and wellness. Follow your health care provider's instructions about healthy diet, exercising, and getting tested or screened for diseases. Follow your health care provider's instructions on monitoring your cholesterol and blood pressure. This information is not intended to replace advice given to you by your health care provider. Make sure you discuss any questions you have with your health care provider. Document Revised: 10/14/2020 Document Reviewed: 10/14/2020 Elsevier Patient Education  2024 [employer]vier Inc.  

## 2023-06-28 NOTE — Progress Notes (Deleted)
04/08/17 colon  D Lipid   Ct coron 0 10/12/19

## 2023-06-29 ENCOUNTER — Other Ambulatory Visit: Payer: Self-pay | Admitting: Medical

## 2023-06-29 LAB — LIPID PANEL
Cholesterol, Total: 156 mg/dL (ref 100–199)
HDL: 53 mg/dL (ref >39)
LDL CALC COMMENT:: 2.9 ratio (ref 0.0–5.0)
LDL Chol Calc (NIH): 90 mg/dL (ref 0–99)
Triglycerides: 67 mg/dL (ref 0–149)
VLDL Cholesterol Cal: 13 mg/dL (ref 5–40)

## 2023-06-29 LAB — COMPREHENSIVE METABOLIC PANEL
ALT: 22 IU/L (ref 0–44)
AST: 20 IU/L (ref 0–40)
Albumin: 4.3 g/dL (ref 3.8–4.9)
Alkaline Phosphatase: 124 IU/L — ABNORMAL HIGH (ref 44–121)
BUN/Creatinine Ratio: 13 (ref 9–20)
BUN: 14 mg/dL (ref 6–24)
Bilirubin Total: 0.6 mg/dL (ref 0.0–1.2)
CO2: 23 mmol/L (ref 20–29)
Calcium: 9 mg/dL (ref 8.7–10.2)
Chloride: 101 mmol/L (ref 96–106)
Creatinine, Ser: 1.09 mg/dL (ref 0.76–1.27)
Globulin, Total: 2.2 g/dL (ref 1.5–4.5)
Glucose: 94 mg/dL (ref 70–99)
Potassium: 4.1 mmol/L (ref 3.5–5.2)
Sodium: 139 mmol/L (ref 134–144)
Total Protein: 6.5 g/dL (ref 6.0–8.5)
eGFR: 80 mL/min/{1.73_m2} (ref >59)

## 2023-06-29 LAB — CBC
Hematocrit: 45.7 % (ref 37.5–51.0)
Hemoglobin: 15.2 g/dL (ref 13.0–17.7)
MCH: 29.9 pg (ref 26.6–33.0)
MCHC: 33.3 g/dL (ref 31.5–35.7)
MCV: 90 fL (ref 79–97)
Platelets: 311 10*3/uL (ref 150–450)
RBC: 5.08 x10E6/uL (ref 4.14–5.80)
RDW: 13 % (ref 11.6–15.4)
WBC: 5.8 10*3/uL (ref 3.4–10.8)

## 2023-06-29 LAB — VITAMIN D 25 HYDROXY (VIT D DEFICIENCY, FRACTURES): Vit D, 25-Hydroxy: 16.8 ng/mL — ABNORMAL LOW (ref 30.0–100.0)

## 2023-06-29 LAB — PSA: Prostate Specific Ag, Serum: 1.1 ng/mL (ref 0.0–4.0)

## 2023-06-29 MED ORDER — ROSUVASTATIN CALCIUM 10 MG PO TABS: 10.0000 mg | ORAL_TABLET | Freq: Every day | ORAL | 3 refills | Status: DC

## 2023-06-29 MED ORDER — VITAMIN D 50 MCG (2000 UT) PO CAPS: 1.0000 | ORAL_CAPSULE | Freq: Every day | ORAL | 3 refills | Status: DC

## 2023-06-29 NOTE — Progress Notes (Signed)
Results sent through MyChart

## 2023-07-23 ENCOUNTER — Encounter: Payer: Self-pay | Admitting: Nurse Practitioner

## 2023-07-23 ENCOUNTER — Telehealth: Payer: 59 | Admitting: Nurse Practitioner

## 2023-07-23 VITALS — Temp 98.7°F | Ht 72.0 in | Wt 232.0 lb

## 2023-07-23 DIAGNOSIS — J111 Influenza due to unidentified influenza virus with other respiratory manifestations: Secondary | ICD-10-CM | POA: Diagnosis not present

## 2023-07-23 MED ORDER — HYDROCODONE BIT-HOMATROP MBR 5-1.5 MG/5ML PO SOLN: 5.0000 mL | Freq: Three times a day (TID) | ORAL | 0 refills | Status: DC | PRN

## 2023-07-23 NOTE — Patient Instructions (Addendum)
I have sent in the medication to the pharmacy for you.   Alternate tylenol 1000mg  and ibuprofen 800mg  every 4 hours for pain and aching.   While symptoms can improve, the cough from the flu is appearing to linger for quite a while in most patients. If your cough is lasting longer than 4 weeks or if you get well then start to have a fever again with the cough, please let us know.

## 2023-07-23 NOTE — Progress Notes (Signed)
Virtual Visit Encounter mychart visit.   I connected with  Aaron Patel. on 07/23/23 at  2:30 PM EST by secure video and audio telemedicine application. I verified that I am speaking with the correct person using two identifiers.   I introduced myself as a Publishing rights manager with the practice. The limitations of evaluation and management by telemedicine discussed with the patient and the availability of in person appointments. The patient expressed verbal understanding and consent to proceed.  Participating parties in this visit include: Myself and patient  The patient is: Patient Location: Home I am: Provider Location: Office/Clinic Subjective:    CC and HPI: Aaron Patel. is a 58 y.o. year old male presenting for new evaluation and treatment of flu like symptoms.  History of Present Illness Aaron Patel. is a 58 year old male who presents with headache and chills, cough, sinus pressure, and body aches. His cough is productive with a yellow colored sputum.   He has been experiencing headache and chills since Tuesday. The headache is persistent and the worst symptom at this point.   He denies having a sore throat or fever.   He is sleeping well despite the symptoms when taking Mucinex, but does still awaken with the headache.    Past medical history, Surgical history, Family history not pertinant except as noted below, Social history, Allergies, and medications have been entered into the medical record, reviewed, and corrections made.   Review of Systems:  All review of systems negative except what is listed in the HPI  Objective:    Alert and oriented x 4 Speaking in clear sentences with no shortness of breath. No distress.  Impression and Recommendations:    Problem List Items Addressed This Visit   None Visit Diagnoses       Influenza    -  Primary   Relevant Medications   HYDROcodone bit-homatropine (HYCODAN) 5-1.5 MG/5ML syrup  Acute onset of  headache, chills, and cough since Tuesday. No fever reported. Symptoms consistent with prevalent influenza strain this season. Patient experiencing symptom improvement overall but ongoing headache is problematic. We will hold tamiflu given symptom improvement. - Prescribe cough, congestion, and pain medication every 8 hours - Recommend rest and hydration - Alternate tylenol 1000mg  and ibuprofen 800mg  every 4 hours for headache and body aches.      orders and follow up as documented in EMR I discussed the assessment and treatment plan with the patient. The patient was provided an opportunity to ask questions and all were answered. The patient agreed with the plan and demonstrated an understanding of the instructions.   The patient was advised to call back or seek an in-person evaluation if the symptoms worsen or if the condition fails to improve as anticipated.  Follow-Up: prn  I provided 15 minutes of non-face-to-face interaction with this non face-to-face encounter including intake, same-day documentation, and chart review.   Tollie Eth, NP , DNP, AGNP-c [facility]

## 2023-08-26 ENCOUNTER — Other Ambulatory Visit: Payer: 59

## 2023-09-13 ENCOUNTER — Ambulatory Visit
Admission: RE | Admit: 2023-09-13 | Discharge: 2023-09-13 | Disposition: A | Discharge status: Home / Self Care | Payer: Worker's Compensation | Source: Ambulatory Visit | Attending: Occupational Medicine | Admitting: Occupational Medicine

## 2023-09-13 ENCOUNTER — Other Ambulatory Visit: Payer: Self-pay | Admitting: Occupational Medicine

## 2023-09-13 DIAGNOSIS — M25562 Pain in left knee: Secondary | ICD-10-CM

## 2023-09-20 ENCOUNTER — Other Ambulatory Visit

## 2023-09-21 ENCOUNTER — Other Ambulatory Visit

## 2023-09-21 DIAGNOSIS — Z23 Encounter for immunization: Secondary | ICD-10-CM

## 2024-02-02 ENCOUNTER — Telehealth: Payer: Self-pay | Admitting: Medical

## 2024-02-02 NOTE — Telephone Encounter (Signed)
 Pt dropped physical forms for completion, needs ASAP  Forms sent back in folder, pt has cpe in January

## 2024-02-03 ENCOUNTER — Other Ambulatory Visit: Payer: Self-pay | Admitting: Medical

## 2024-02-03 ENCOUNTER — Other Ambulatory Visit

## 2024-02-03 DIAGNOSIS — Z139 Encounter for screening, unspecified: Secondary | ICD-10-CM

## 2024-02-03 DIAGNOSIS — Z111 Encounter for screening for respiratory tuberculosis: Secondary | ICD-10-CM

## 2024-02-03 NOTE — Telephone Encounter (Signed)
 Patient does not have any records of MMR and HEP B. Pt will come in today for titers and TB blood test  And he has no issues/ concerns with hearing or seeing

## 2024-02-06 ENCOUNTER — Ambulatory Visit: Payer: Self-pay | Admitting: Medical

## 2024-02-06 NOTE — Progress Notes (Signed)
 Quantiferon tuberculosis test still pending You are immune to MMR You are not immune to hepatitis B.  Please return at your convenience for the 2 part Hepsilav B vaccine, 1 dose a month apart

## 2024-02-07 LAB — QUANTIFERON-TB GOLD PLUS
QuantiFERON Mitogen Value: >10 [IU]/mL
QuantiFERON Nil Value: 0.03 [IU]/mL
QuantiFERON TB1 Ag Value: 0.08 [IU]/mL
QuantiFERON TB2 Ag Value: 0.06 [IU]/mL
QuantiFERON-TB Gold Plus: NEGATIVE

## 2024-02-07 LAB — MEASLES/MUMPS/RUBELLA IMMUNITY
MUMPS ABS, IGG: 49.5 [AU]/ml (ref >10.9)
RUBEOLA AB, IGG: 207 [AU]/ml (ref >16.4)
Rubella Antibodies, IGG: 3.88 {index} (ref >0.99)

## 2024-02-07 LAB — HEPATITIS B SURFACE ANTIBODY,QUALITATIVE: Hep B Surface Ab, Qual: NONREACTIVE

## 2024-02-08 ENCOUNTER — Other Ambulatory Visit (INDEPENDENT_AMBULATORY_CARE_PROVIDER_SITE_OTHER)

## 2024-02-08 ENCOUNTER — Telehealth: Payer: Self-pay | Admitting: *Deleted

## 2024-02-08 DIAGNOSIS — Z23 Encounter for immunization: Secondary | ICD-10-CM

## 2024-02-08 NOTE — Progress Notes (Signed)
 Your QuantiFERON gold tuberculosis screening test was negative.  See other message and form

## 2024-02-08 NOTE — Telephone Encounter (Signed)
 Copied from CRM #[id number]. Topic: Clinical - Lab/Test Results >> Feb 08, 2024 11:26 AM Graeme ORN wrote: Reason for CRM: Patient called. Returned missed call for results. Read note as written by provider/Patient wanted to know about forms. Let him know would need to start Hep B and forms could be completed. Patient will come today for Hep B. Wanted to make sure providers had both forms - two forms for two different employers. Thank You  Patient coming to start Hep B at 1:45 today.

## 2024-04-28 ENCOUNTER — Encounter: Payer: Self-pay | Admitting: Student in an Organized Health Care Education/Training Program

## 2024-04-28 ENCOUNTER — Ambulatory Visit
Payer: Self-pay | Attending: Student in an Organized Health Care Education/Training Program | Admitting: Student in an Organized Health Care Education/Training Program

## 2024-04-28 VITALS — BP 134/84 | HR 74 | Ht 72.0 in | Wt 236.4 lb

## 2024-04-28 DIAGNOSIS — R072 Precordial pain: Secondary | ICD-10-CM | POA: Diagnosis not present

## 2024-04-28 DIAGNOSIS — E782 Mixed hyperlipidemia: Secondary | ICD-10-CM

## 2024-04-28 DIAGNOSIS — R030 Elevated blood-pressure reading, without diagnosis of hypertension: Secondary | ICD-10-CM

## 2024-04-28 DIAGNOSIS — Z8249 Family history of ischemic heart disease and other diseases of the circulatory system: Secondary | ICD-10-CM

## 2024-04-28 NOTE — Assessment & Plan Note (Signed)
-   He is on Crestor  for primary prevention.  Last CAC score was 0. -Will check a lipoprotein a for additional restratification.  Also an A1c given that he is overweight. Lipoprotein a A1c Continue Crestor  10 mg daily

## 2024-04-28 NOTE — Patient Instructions (Signed)
 Medication Instructions:   Your physician recommends that you continue on your current medications as directed. Please refer to the Current Medication list given to you today.   *If you need a refill on your cardiac medications before your next appointment, please call your pharmacy*  Lab Work: LP(a), A1C If you have labs (blood work) drawn today and your tests are completely normal, you will receive your results only by: MyChart Message (if you have MyChart) OR A paper copy in the mail If you have any lab test that is abnormal or we need to change your treatment, we will call you to review the results.  Testing/Procedures: STRESS ECHO  Your physician has requested that you have a stress echocardiogram. For further information please visit [email or web address] Please follow instruction sheet as given.  Please note: We ask at that you not bring children with you during ultrasound (echo/ vascular) testing. Due to room size and safety concerns, children are not allowed in the ultrasound rooms during exams. Our front office staff cannot provide observation of children in our lobby area while testing is being conducted. An adult accompanying a patient to their appointment will only be allowed in the ultrasound room at the discretion of the ultrasound technician under special circumstances. We apologize for any inconvenience.   Follow-Up: At [facility], you and your health needs are our priority.  As part of our continuing mission to provide you with exceptional heart care, our providers are all part of one team.  This team includes your primary Cardiologist (physician) and Advanced Practice Providers or APPs (Physician Assistants and Nurse Practitioners) who all work together to provide you with the care you need, when you need it.  Your next appointment:   3 month(s)  Provider:   Georganna Archer, MD    We recommend signing up for the patient portal called MyChart.  Sign up  information is provided on this After Visit Summary.  MyChart is used to connect with patients for Virtual Visits (Telemedicine).  Patients are able to view lab/test results, encounter notes, upcoming appointments, etc.  Non-urgent messages can be sent to your provider as well.   To learn more about what you can do with MyChart, go to [email or web address].   Other Instructions   DASH Eating Plan DASH stands for Dietary Approaches to Stop Hypertension. The DASH eating plan is a healthy eating plan that has been shown to: Lower high blood pressure (hypertension). Reduce your risk for type 2 diabetes, heart disease, and stroke. Help with weight loss. What are tips for following this plan? Reading food labels Check food labels for the amount of salt (sodium) per serving. Choose foods with less than 5 percent of the Daily Value (DV) of sodium. In general, foods with less than 300 milligrams (mg) of sodium per serving fit into this eating plan. To find whole grains, look for the word whole as the first word in the ingredient list. Shopping Buy products labeled as low-sodium or no salt added. Buy fresh foods. Avoid canned foods and pre-made or frozen meals. Cooking Try not to add salt when you cook. Use salt-free seasonings or herbs instead of table salt or sea salt. Check with your health care provider or pharmacist before using salt substitutes. Do not fry foods. Cook foods in healthy ways, such as baking, boiling, grilling, roasting, or broiling. Cook using oils that are good for your heart. These include olive, canola, avocado, soybean, and sunflower oil. Meal planning  Eat a balanced diet.  This should include: 4 or more servings of fruits and 4 or more servings of vegetables each day. Try to fill half of your plate with fruits and vegetables. 6-8 servings of whole grains each day. 6 or less servings of lean meat, poultry, or fish each day. 1 oz is 1 serving. A 3 oz (85 g) serving of  meat is about the same size as the palm of your hand. One egg is 1 oz (28 g). 2-3 servings of low-fat dairy each day. One serving is 1 cup (237 mL). 1 serving of nuts, seeds, or beans 5 times each week. 2-3 servings of heart-healthy fats. Healthy fats called omega-3 fatty acids are found in foods such as walnuts, flaxseeds, fortified milks, and eggs. These fats are also found in cold-water fish, such as sardines, salmon, and mackerel. Limit how much you eat of: Canned or prepackaged foods. Food that is high in trans fat, such as fried foods. Food that is high in saturated fat, such as fatty meat. Desserts and other sweets, sugary drinks, and other foods with added sugar. Full-fat dairy products. Do not salt foods before eating. Do not eat more than 4 egg yolks a week. Try to eat at least 2 vegetarian meals a week. Eat more home-cooked food and less restaurant, buffet, and fast food. Lifestyle When eating at a restaurant, ask if your food can be made with less salt or no salt. If you drink alcohol: Limit how much you have to: 0-1 drink a day if you are male. 0-2 drinks a day if you are male. Know how much alcohol is in your drink. In the [place] one drink is one 12 oz bottle of beer (355 mL), one 5 oz glass of wine (148 mL), or one 1 oz glass of hard liquor (44 mL). General information Avoid eating more than 2,300 mg of salt a day. If you have hypertension, you may need to reduce your sodium intake to 1,500 mg a day. Work with your provider to stay at a healthy body weight or lose weight. Ask what the best weight range is for you. On most days of the week, get at least 30 minutes of exercise that causes your heart to beat faster. This may include walking, swimming, or biking. Work with your provider or dietitian to adjust your eating plan to meet your specific calorie needs. What foods should I eat? Fruits All fresh, dried, or frozen fruit. Canned fruits that are in their natural juice  and do not have sugar added to them. Vegetables Fresh or frozen vegetables that are raw, steamed, roasted, or grilled. Low-sodium or reduced-sodium tomato and vegetable juice. Low-sodium or reduced-sodium tomato sauce and tomato paste. Low-sodium or reduced-sodium canned vegetables. Grains Whole-grain or whole-wheat bread. Whole-grain or whole-wheat pasta. Brown rice. Mcneil Madeira. Bulgur. Whole-grain and low-sodium cereals. Pita bread. Low-fat, low-sodium crackers. Whole-wheat flour tortillas. Meats and other proteins Skinless chicken or turkey. Ground chicken or turkey. Pork with fat trimmed off. Fish and seafood. Egg whites. Dried beans, peas, or lentils. Unsalted nuts, nut butters, and seeds. Unsalted canned beans. Lean cuts of beef with fat trimmed off. Low-sodium, lean precooked or cured meat, such as sausages or meat loaves. Dairy Low-fat (1%) or fat-free (skim) milk. Reduced-fat, low-fat, or fat-free cheeses. Nonfat, low-sodium ricotta or cottage cheese. Low-fat or nonfat yogurt. Low-fat, low-sodium cheese. Fats and oils Soft margarine without trans fats. Vegetable oil. Reduced-fat, low-fat, or light mayonnaise and salad dressings (reduced-sodium). Canola, safflower, olive, avocado, soybean, and  sunflower oils. Avocado. Seasonings and condiments Herbs. Spices. Seasoning mixes without salt. Other foods Unsalted popcorn and pretzels. Fat-free sweets. The items listed above may not be all the foods and drinks you can have. Talk to a dietitian to learn more. What foods should I avoid? Fruits Canned fruit in a light or heavy syrup. Fried fruit. Fruit in cream or butter sauce. Vegetables Creamed or fried vegetables. Vegetables in a cheese sauce. Regular canned vegetables that are not marked as low-sodium or reduced-sodium. Regular canned tomato sauce and paste that are not marked as low-sodium or reduced-sodium. Regular tomato and vegetable juices that are not marked as low-sodium or  reduced-sodium. Dene. Olives. Grains Baked goods made with fat, such as croissants, muffins, or some breads. Dry pasta or rice meal packs. Meats and other proteins Fatty cuts of meat. Ribs. Fried meat. Aldona. Bologna, salami, and other precooked or cured meats, such as sausages or meat loaves, that are not lean and low in sodium. Fat from the back of a pig (fatback). Bratwurst. Salted nuts and seeds. Canned beans with added salt. Canned or smoked fish. Whole eggs or egg yolks. Chicken or turkey with skin. Dairy Whole or 2% milk, cream, and half-and-half. Whole or full-fat cream cheese. Whole-fat or sweetened yogurt. Full-fat cheese. Nondairy creamers. Whipped toppings. Processed cheese and cheese spreads. Fats and oils Butter. Stick margarine. Lard. Shortening. Ghee. Bacon fat. Tropical oils, such as coconut, palm kernel, or palm oil. Seasonings and condiments Onion salt, garlic salt, seasoned salt, table salt, and sea salt. Worcestershire sauce. Tartar sauce. Barbecue sauce. Teriyaki sauce. Soy sauce, including reduced-sodium soy sauce. Steak sauce. Canned and packaged gravies. Fish sauce. Oyster sauce. Cocktail sauce. Store-bought horseradish. Ketchup. Mustard. Meat flavorings and tenderizers. Bouillon cubes. Hot sauces. Pre-made or packaged marinades. Pre-made or packaged taco seasonings. Relishes. Regular salad dressings. Other foods Salted popcorn and pretzels. The items listed above may not be all the foods and drinks you should avoid. Talk to a dietitian to learn more. Where to find more information National Heart, Lung, and Blood Institute (NHLBI): buffalodrycleaner.gl American Heart Association (AHA): heart.org Academy of Nutrition and Dietetics: eatright.org National Kidney Foundation (NKF): kidney.org This information is not intended to replace advice given to you by your health care provider. Make sure you discuss any questions you have with your health care provider. Document Revised:  06/11/2022 Document Reviewed: 06/11/2022 Elsevier Patient Education  2024 [employer].

## 2024-04-28 NOTE — Progress Notes (Signed)
 Cardiology Office Note:   Date:  04/28/2024  ID:  Aaron JONELLE Rolan Mickey., DOB Oct 13, 1965, MRN [id number] PCP: Bulah Alm GORMAN DEVONNA  [facility] Providers Cardiologist:  Georganna Archer, MD { Chief Complaint:  Chief Complaint  Patient presents with   Chest Pain      History of Present Illness:   Aaron Letts. is a 58 y.o. male with a PMH of HLD who presents for follow up.  Doing well overall but does have intermittent chest pains which he is concerned about.  No other symptoms.  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  Has gained some weight but wants to be more conscientious about diet.  No new bad habits.  Smokes cigars on average every 3 months.   Past Medical History:  Diagnosis Date   Chest pain    cath 12/31/10: post AV CFX < 10% (no significant CAD), EF 60%    Family history of ischemic heart disease    GERD (gastroesophageal reflux disease)    intermittent   HLD (hyperlipidemia)    Hx of cardiovascular stress test 11/2016   normal exercise stress test   Hx of echocardiogram Aug 2012   Normal   Lipoma    Dr. Camellia Blush, consult 2013, pending surgery   Obesity    Peyronie disease    Urology consult prior     Studies Reviewed:    EKG:  EKG Interpretation Date/Time:  Friday April 28 2024 15:24:38 EST Ventricular Rate:  74 PR Interval:  148 QRS Duration:  76 QT Interval:  372 QTC Calculation: 412 R Axis:   7  Text Interpretation: Sinus rhythm with Premature atrial complexes When compared with ECG of 03-Jan-2016 10:41, Premature atrial complexes are now Present Confirmed by Archer Georganna [phone]) on 04/28/2024 3:32:28 PM     Cardiac Studies & Procedures   ______________________________________________________________________________________________   STRESS TESTS  EXERCISE TOLERANCE TEST (ETT) 12/03/2016  Interpretation Summary  Blood pressure demonstrated a normal response to exercise.  There was no ST  segment deviation noted during stress.  Clinically and electrically negative for ischemia  Excellent exercise capacity        CT SCANS  CT CARDIAC SCORING (SELF PAY ONLY) 10/12/2019  Addendum 10/13/2019  5:59 PM ADDENDUM REPORT: 10/13/2019 17:57  CLINICAL DATA:  Risk stratification  EXAM: Coronary Calcium  Score  TECHNIQUE: The patient was scanned on a [employer] scanner. Axial non-contrast 3 mm slices were carried out through the heart. The data set was analyzed on a dedicated work station and scored using the Agatson method.  FINDINGS: Non-cardiac: See separate report from [facility].  Ascending Aorta: Normal Caliber.  No Calcifications.  Pericardium: Normal  Coronary arteries: Normal coronary origins.  IMPRESSION: Coronary calcium  score of 0. This was 0 percentile for age and sex matched control.  Wilbert Bihari   Electronically Signed By: Wilbert Bihari On: 10/13/2019 17:57  Narrative EXAM: OVER-READ INTERPRETATION  CT CHEST  The following report is an over-read performed by radiologist Dr. Toribio Aye of [facility] on 10/12/2019. This over-read does not include interpretation of cardiac or coronary anatomy or pathology. The coronary calcium  score interpretation by the cardiologist is attached.  COMPARISON:  None.  FINDINGS: Within the visualized portions of the thorax there are no suspicious appearing pulmonary nodules or masses, there is no acute consolidative airspace disease, no pleural effusions, no pneumothorax and no lymphadenopathy. Visualized portions of the upper abdomen are unremarkable. There are no aggressive appearing  lytic or blastic lesions noted in the visualized portions of the skeleton.  IMPRESSION: No significant incidental noncardiac findings are noted.  Electronically Signed: By: Toribio Aye M.D. On: 10/12/2019 16:41      ______________________________________________________________________________________________      Risk Assessment/Calculations:              Physical Exam:     VS:  BP 134/84   Pulse 74   Ht 6' (1.829 m)   Wt 236 lb 6.4 oz (107.2 kg)   SpO2 99%   BMI 32.06 kg/m      Wt Readings from Last 3 Encounters:  07/23/23 232 lb (105.2 kg)  06/28/23 231 lb 3.2 oz (104.9 kg)  01/06/23 232 lb 3.2 oz (105.3 kg)     GEN: Well nourished, well developed, in no acute distress NECK: No JVD; No carotid bruits CARDIAC: RRR, no murmurs, rubs, gallops RESPIRATORY:  Clear to auscultation without rales, wheezing or rhonchi  ABDOMEN: Soft, non-tender, non-distended, normal bowel sounds EXTREMITIES:  Warm and well perfused, no edema; No deformity, 2+ radial pulses PSYCH: Normal mood and affect   Assessment & Plan Precordial pain - Having some atypical/intermittent chest pains.  I suspect that the patient does not have obstructive coronary disease; however, given his family history is concerned. -I think is reasonable to perform a stress echocardiogram for evaluation. Stress echocardiogram Mixed hyperlipidemia - He is on Crestor  for primary prevention.  Last CAC score was 0. -Will check a lipoprotein a for additional restratification.  Also an A1c given that he is overweight. Lipoprotein a A1c Continue Crestor  10 mg daily Elevated blood pressure reading - Blood pressure reading is a little bit elevated today and he said his last reading at home was also a little bit elevated. - This may represent stage I hypertension. - We had a discussion about lifestyle modifications versus lifestyle modifications + medicine.  We both agree that trialing lifestyle modifications first is completely reasonable. - Will follow-up in 3 months to see how he goes. Try lifestyle modifications Information on the DASH diet presented Follow-up in 3 months      Informed Consent   Shared Decision  Making/Informed Consent The risks [chest pain, shortness of breath, cardiac arrhythmias, dizziness, blood pressure fluctuations, myocardial infarction, stroke/transient ischemic attack, and life-threatening complications (estimated to be 1 in 10,000)], benefits (risk stratification, diagnosing coronary artery disease, treatment guidance) and alternatives of a stress or dobutamine stress echocardiogram were discussed in detail with Aaron Patel and he agrees to proceed.      This note was written with the assistance of a dictation microphone or AI dictation software. Please excuse any typos or grammatical errors.   Signed, Georganna Archer, MD 04/28/2024 12:46 PM    [facility]

## 2024-05-04 LAB — HEMOGLOBIN A1C
Est. average glucose Bld gHb Est-mCnc: 117 mg/dL
Hgb A1c MFr Bld: 5.7 % — ABNORMAL HIGH (ref 4.8–5.6)

## 2024-05-04 LAB — LIPOPROTEIN A (LPA): Lipoprotein (a): 46.5 nmol/L (ref <75.0)

## 2024-05-05 ENCOUNTER — Ambulatory Visit: Payer: Self-pay | Admitting: Student in an Organized Health Care Education/Training Program

## 2024-05-19 ENCOUNTER — Encounter (HOSPITAL_COMMUNITY): Payer: Self-pay | Admitting: *Deleted

## 2024-05-22 ENCOUNTER — Telehealth (HOSPITAL_COMMUNITY): Payer: Self-pay

## 2024-05-22 NOTE — Telephone Encounter (Signed)
 Detailed instructions given. S.Klever [place] CCT

## 2024-05-26 ENCOUNTER — Ambulatory Visit (HOSPITAL_COMMUNITY)

## 2024-06-28 ENCOUNTER — Ambulatory Visit: Payer: 59 | Admitting: Medical

## 2024-06-28 ENCOUNTER — Ambulatory Visit: Attending: Medical

## 2024-06-28 ENCOUNTER — Encounter: Payer: Self-pay | Admitting: Medical

## 2024-06-28 VITALS — BP 122/84 | HR 73 | Ht 71.25 in | Wt 232.0 lb

## 2024-06-28 DIAGNOSIS — Z0000 Encounter for general adult medical examination without abnormal findings: Secondary | ICD-10-CM

## 2024-06-28 DIAGNOSIS — R002 Palpitations: Secondary | ICD-10-CM | POA: Diagnosis not present

## 2024-06-28 DIAGNOSIS — M545 Low back pain, unspecified: Secondary | ICD-10-CM | POA: Diagnosis not present

## 2024-06-28 DIAGNOSIS — Z1211 Encounter for screening for malignant neoplasm of colon: Secondary | ICD-10-CM | POA: Diagnosis not present

## 2024-06-28 DIAGNOSIS — E559 Vitamin D deficiency, unspecified: Secondary | ICD-10-CM

## 2024-06-28 DIAGNOSIS — Z23 Encounter for immunization: Secondary | ICD-10-CM | POA: Diagnosis not present

## 2024-06-28 DIAGNOSIS — Z9889 Other specified postprocedural states: Secondary | ICD-10-CM | POA: Diagnosis not present

## 2024-06-28 DIAGNOSIS — E782 Mixed hyperlipidemia: Secondary | ICD-10-CM | POA: Diagnosis not present

## 2024-06-28 DIAGNOSIS — Z125 Encounter for screening for malignant neoplasm of prostate: Secondary | ICD-10-CM

## 2024-06-28 DIAGNOSIS — G8929 Other chronic pain: Secondary | ICD-10-CM

## 2024-06-28 LAB — LIPID PANEL

## 2024-06-28 MED ORDER — WEGOVY 0.25 MG/0.5ML ~~LOC~~ SOAJ: 0.2500 mg | SUBCUTANEOUS | 0 refills | Status: AC

## 2024-06-28 NOTE — Progress Notes (Unsigned)
 Enrolled for [employer] to mail a ZIO XT long term holter monitor to the patients address on file.   EP to read

## 2024-06-28 NOTE — Progress Notes (Addendum)
 "  Name: Aaron Patel.   Date of Visit: 06/28/24   Date of last visit with me: 02/02/2024   CHIEF COMPLAINT:  Chief Complaint  Patient presents with   Annual Exam    Fasting cpe, discuss possible weight loss medication, he feels he can feel his heart murmur when laying in a certain position, was told as a child he has one       HPI:  Discussed the use of AI scribe software for clinical note transcription with the patient, who gave verbal consent to proceed.  History of Present Illness  Aaron Patel. is a 59 year old male who presents for a wellness visit.  He experiences intermittent heart palpitations, describing them as his heart 'almost skips a beat sometimes.' These episodes occur randomly, particularly when he lies down in certain positions. He has a family history of heart issues, as both parents passed from heart-related conditions. He consumes caffeine infrequently, about two to three times a week.  He has a history of high cholesterol, acid reflux, lipoma, and vitamin D  deficiency. He is currently taking Crestor  10 mg daily for cholesterol and plans to restart vitamin D  2000 IU daily, as he has not been taking vitamin D  recently.  He mentions chronic back issues and is considering weight management options due to his weight remaining stable and its potential impact on his back pain.  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 He does not smoke, consume alcohol, or use drugs.  He has undergone a cardiac catheterization in 2012 and a colonoscopy in 2018, which was clean. He has not completed a FIT test in the last couple of years.  He had a CT coronary calcium  test in May 2021, which showed a score of zero. He was supposed to have an echocardiogram recently but had to reschedule due to insurance changes after retirement. He is seeking a provider within his insurance network  due to high copays.  He had a sleep study over ten years ago but is not currently on CPAP. He does snore loud according to his wife, and he does experience sleepiness in the midafternoon.   Allergies[1]  Past Medical History:  Diagnosis Date   Chest pain    cath 12/31/10: post AV CFX < 10% (no significant CAD), EF 60%    Family history of ischemic heart disease    GERD (gastroesophageal reflux disease)    intermittent   HLD (hyperlipidemia)    Hx of cardiovascular stress test 11/2016   normal exercise stress test   Hx of echocardiogram 01/2011   Normal   Lipoma    Dr. Camellia Blush, consult 2013, pending surgery   Obesity    Peyronie disease    Urology consult prior   Vitamin D  deficiency     Medications Ordered Prior to Encounter[2]   Current Medications[3]  Family History  Problem Relation Age of Onset   Asthma Mother    Mental illness Mother    Heart disease Mother 79       died of CHF   Arthritis Father    Diabetes Father    Heart disease Father 62       heart transplant, died of heart disease 5 years later   Hypertension Brother    Arthritis Paternal Aunt    Diabetes Paternal Aunt    Arthritis Paternal Uncle  Diabetes Paternal Uncle    Clotting disorder Maternal Aunt    Colon cancer Maternal Aunt    Cancer Maternal Grandmother    Stroke Neg Hx     Past Surgical History:  Procedure Laterality Date   CARDIAC CATHETERIZATION  12/2010   Dr. Vergil    COLONOSCOPY  04/2017   normal, Dr. Gordy Starch   LIPOMA EXCISION N/A 01/03/2016   Procedure: EXCISION LOWER BACK LIPOMA;  Surgeon: Camellia Blush, MD;  Location: [facility] OR;  Service: General;  Laterality: N/A;   stye surgery      ROS as in subjective   Objective: BP 122/84   Pulse 73   Ht 5' 11.25 (1.81 m)   Wt 232 lb (105.2 kg)   SpO2 96%   BMI 32.13 kg/m   General appearence: alert, no distress, WD/WN, white male HEENT: normocephalic, sclerae anicteric, PERRLA, EOMi, nares patent, no discharge or  erythema, pharynx normal Oral cavity: MMM, no lesions Neck: supple, no lymphadenopathy, no thyromegaly, no masses, no bruits Heart: RRR, normal S1, S2, no murmurs Lungs: CTA bilaterally, no wheezes, rhonchi, or rales Abdomen: +bs, soft, non tender, non distended, no masses, no hepatomegaly, no splenomegaly Back: non tender, lumbar surgical scar Musculoskeletal: nontender, no swelling, no obvious deformity Extremities: no edema, no cyanosis, no clubbing Pulses: 2+ symmetric, upper and lower extremities, normal cap refill Neurological: alert, oriented x 3, CN2-12 intact, strength normal upper extremities and lower extremities, sensation normal throughout, DTRs 2+ throughout, no cerebellar signs, gait normal Psychiatric: normal affect, behavior normal, pleasant  GU: Normal male, circumcised, no mass, no hernia, no lymphadenopathy Rectal: Anus normal tone, prostate within normal limits   Radiology CT coronary calcium  (10/2019): Coronary artery calcium  score zero  Diagnostic Colonoscopy (2018): Normal; no abnormalities EKG (04/2024): Premature atrial complexes (Independently interpreted)    Assessment: Encounter Diagnoses  Name Primary?   Encounter for health maintenance examination in adult Yes   COVID-19 vaccine administered    Pneumococcal 20-valent conjugate vaccine administered    Vitamin D  deficiency    Screening for prostate cancer    Mixed hyperlipidemia    Palpitation    Morbid obesity (HCC)      Plan:  General Health Maintenance Routine health maintenance discussed. Up to date on tetanus, shingles, pneumonia, and COVID vaccines. Due for Hepatitis B vaccine #2. Colonoscopy in 2018 was normal, next due in 2028. FIT test recommended every 3-5 years, due now. - Administer Hepatitis B vaccine at a later date. - Provided FIT test kit for home use. - Continue routine health maintenance screenings. -covid and prevnar 20 vaccines administered today  Screening for prostate  cancer Due for PSA screening as part of routine health maintenance. - Ordered PSA test.  Morbid obesity associated with hyperlipidemia and chronic back pain, worsened by obesity Chronic weight management issue with associated back problems. Discussed potential use of weight loss medications such as Zepbound or Wegovy , which require titration and frequent follow-ups. Potential side effects include nausea, indigestion, and constipation. - Sent prescription for weight loss medication to pharmacy to check insurance coverage.  Palpitations Intermittent palpitations, possibly related to premature atrial complexes noted on previous EKG. Family history of heart issues. Discussed potential evaluation by cardiology and the use of an event monitor for further assessment. Consideration of sleep apnea as a contributing factor. - Ordered event monitor (Zio patch) for heart rhythm evaluation. - Ordered home sleep study. - he is pending follow up with  cardiology for possible echo  Mixed hyperlipidemia Currently managed  with Crestor  10 mg daily.  Vitamin D  deficiency Previously managed with vitamin D  supplementation. Currently not taking vitamin D  supplements. - Restart vitamin D  supplementation.  History of back surgery -continue routine exercise, stretching -begin trial of weight loss medication   Shields was seen today for annual exam.  Diagnoses and all orders for this visit:  Encounter for health maintenance examination in adult -     CBC -     Comprehensive metabolic panel with GFR -     Lipid panel -     PSA -     TSH + free T4 -     Urinalysis, Routine w reflex microscopic -     VITAMIN D  25 Hydroxy (Vit-D Deficiency, Fractures) -     Magnesium  COVID-19 vaccine administered -     Pfizer Comirnaty Covid-19 Vaccine 40yrs & older  Pneumococcal 20-valent conjugate vaccine administered -     Pneumococcal conjugate vaccine 20-valent (Prevnar 20)  Vitamin D  deficiency -     VITAMIN D   25 Hydroxy (Vit-D Deficiency, Fractures)  Screening for prostate cancer -     PSA  Mixed hyperlipidemia -     Lipid panel  Palpitation -     CBC -     Comprehensive metabolic panel with GFR -     TSH + free T4 -     Magnesium  Morbid obesity (HCC)   F/u pending labs, studies          [1]  Allergies Allergen Reactions   Iodinated Contrast Media Hives and Rash  [2]  Current Outpatient Medications on File Prior to Visit  Medication Sig Dispense Refill   rosuvastatin  (CRESTOR ) 10 MG tablet Take 1 tablet (10 mg total) by mouth daily. 90 tablet 3   Cholecalciferol (VITAMIN D ) 50 MCG (2000 UT) CAPS Take 1 capsule (2,000 Units total) by mouth daily. (Patient not taking: Reported on 06/28/2024) 90 capsule 3   No current facility-administered medications on file prior to visit.  [3]  Current Outpatient Medications:    rosuvastatin  (CRESTOR ) 10 MG tablet, Take 1 tablet (10 mg total) by mouth daily., Disp: 90 tablet, Rfl: 3   Cholecalciferol (VITAMIN D ) 50 MCG (2000 UT) CAPS, Take 1 capsule (2,000 Units total) by mouth daily. (Patient not taking: Reported on 06/28/2024), Disp: 90 capsule, Rfl: 3  "

## 2024-06-28 NOTE — Progress Notes (Signed)
 Faxed over order for sleep study to [facility]  And placed order for Zio monitor

## 2024-06-28 NOTE — Addendum Note (Signed)
 Addended by: VICCI HUSBAND A on: 06/28/2024 09:31 AM   Modules accepted: Orders

## 2024-06-29 ENCOUNTER — Ambulatory Visit: Payer: Self-pay | Admitting: Medical

## 2024-06-29 ENCOUNTER — Other Ambulatory Visit: Payer: Self-pay | Admitting: Medical

## 2024-06-29 LAB — COMPREHENSIVE METABOLIC PANEL WITH GFR
ALT: 24 IU/L (ref 0–44)
AST: 25 IU/L (ref 0–40)
Albumin: 4.5 g/dL (ref 3.8–4.9)
Alkaline Phosphatase: 118 IU/L (ref 47–123)
BUN/Creatinine Ratio: 16 (ref 9–20)
BUN: 15 mg/dL (ref 6–24)
Bilirubin Total: 0.4 mg/dL (ref 0.0–1.2)
CO2: 23 mmol/L (ref 20–29)
Calcium: 9.1 mg/dL (ref 8.7–10.2)
Chloride: 102 mmol/L (ref 96–106)
Creatinine, Ser: 0.91 mg/dL (ref 0.76–1.27)
Globulin, Total: 2.3 g/dL (ref 1.5–4.5)
Glucose: 94 mg/dL (ref 70–99)
Potassium: 4.3 mmol/L (ref 3.5–5.2)
Sodium: 139 mmol/L (ref 134–144)
Total Protein: 6.8 g/dL (ref 6.0–8.5)
eGFR: 98 mL/min/1.73

## 2024-06-29 LAB — LIPID PANEL
Cholesterol, Total: 152 mg/dL (ref 100–199)
HDL: 55 mg/dL
LDL CALC COMMENT:: 2.8 ratio (ref 0.0–5.0)
LDL Chol Calc (NIH): 84 mg/dL (ref 0–99)
Triglycerides: 63 mg/dL (ref 0–149)
VLDL Cholesterol Cal: 13 mg/dL (ref 5–40)

## 2024-06-29 LAB — CBC
Hematocrit: 45.6 % (ref 37.5–51.0)
Hemoglobin: 15.4 g/dL (ref 13.0–17.7)
MCH: 30.4 pg (ref 26.6–33.0)
MCHC: 33.8 g/dL (ref 31.5–35.7)
MCV: 90 fL (ref 79–97)
Platelets: 309 x10E3/uL (ref 150–450)
RBC: 5.07 x10E6/uL (ref 4.14–5.80)
RDW: 13.1 % (ref 11.6–15.4)
WBC: 5.4 x10E3/uL (ref 3.4–10.8)

## 2024-06-29 LAB — URINALYSIS, ROUTINE W REFLEX MICROSCOPIC
Bilirubin, UA: NEGATIVE
Glucose, UA: NEGATIVE
Ketones, UA: NEGATIVE
Leukocytes,UA: NEGATIVE
Nitrite, UA: NEGATIVE
RBC, UA: NEGATIVE
Specific Gravity, UA: 1.026 (ref 1.005–1.030)
Urobilinogen, Ur: 0.2 mg/dL (ref 0.2–1.0)
pH, UA: 5.5 (ref 5.0–7.5)

## 2024-06-29 LAB — VITAMIN D 25 HYDROXY (VIT D DEFICIENCY, FRACTURES): Vit D, 25-Hydroxy: 15.5 ng/mL — AB (ref 30.0–100.0)

## 2024-06-29 LAB — PSA: Prostate Specific Ag, Serum: 1.4 ng/mL (ref 0.0–4.0)

## 2024-06-29 LAB — TSH+FREE T4
Free T4: 1.06 ng/dL (ref 0.82–1.77)
TSH: 2.78 u[IU]/mL (ref 0.450–4.500)

## 2024-06-29 LAB — MAGNESIUM: Magnesium: 2 mg/dL (ref 1.6–2.3)

## 2024-06-29 MED ORDER — ROSUVASTATIN CALCIUM 10 MG PO TABS: 10.0000 mg | ORAL_TABLET | Freq: Every day | ORAL | 3 refills | Status: AC

## 2024-06-29 MED ORDER — VITAMIN D (ERGOCALCIFEROL) 1.25 MG (50000 UNIT) PO CAPS: 50000.0000 [IU] | ORAL_CAPSULE | ORAL | 1 refills | Status: AC

## 2024-06-29 NOTE — Progress Notes (Signed)
 Results through MyChart

## 2024-06-30 ENCOUNTER — Encounter (HOSPITAL_COMMUNITY): Payer: Self-pay | Admitting: *Deleted

## 2024-06-30 NOTE — Telephone Encounter (Unsigned)
 Copied from CRM #[id number]. Topic: Clinical - Medication Question >> Jun 30, 2024  2:47 PM Mercedes MATSU wrote: Reason for CRM: Patient called in to know what he should do to find out if his GLP-1 would be covered by his insurance. I informed the patient that he should contact his insurance to see which one is covered. He also wanted to ask PA Tysinger, if he documented that the Wegovy  he prescribed was for back issues caused by his weight. Patient is also requesting a follow up call and can be reached at [phone].

## 2024-07-07 ENCOUNTER — Other Ambulatory Visit (HOSPITAL_COMMUNITY)

## 2024-07-07 ENCOUNTER — Other Ambulatory Visit (HOSPITAL_COMMUNITY): Payer: Self-pay

## 2024-07-07 ENCOUNTER — Ambulatory Visit (HOSPITAL_COMMUNITY)

## 2024-07-28 ENCOUNTER — Other Ambulatory Visit (HOSPITAL_COMMUNITY)

## 2024-07-28 ENCOUNTER — Ambulatory Visit (HOSPITAL_COMMUNITY)

## 2024-08-01 ENCOUNTER — Ambulatory Visit: Admitting: Student in an Organized Health Care Education/Training Program

## 2025-07-03 ENCOUNTER — Encounter: Admitting: Medical
